# Patient Record
Sex: Female | Born: 1960 | Race: White | Hispanic: No | Marital: Married | Smoking: Never smoker
Health system: Southern US, Community
[De-identification: ages and names within clinical notes are randomized; demographics above are authoritative.]

## PROBLEM LIST (undated history)

## (undated) DIAGNOSIS — R112 Nausea with vomiting, unspecified: Secondary | ICD-10-CM

## (undated) DIAGNOSIS — F32A Depression, unspecified: Secondary | ICD-10-CM

## (undated) DIAGNOSIS — E079 Disorder of thyroid, unspecified: Secondary | ICD-10-CM

## (undated) DIAGNOSIS — Z9889 Other specified postprocedural states: Secondary | ICD-10-CM

## (undated) DIAGNOSIS — F419 Anxiety disorder, unspecified: Secondary | ICD-10-CM

## (undated) DIAGNOSIS — E039 Hypothyroidism, unspecified: Secondary | ICD-10-CM

## (undated) DIAGNOSIS — T8859XA Other complications of anesthesia, initial encounter: Secondary | ICD-10-CM

## (undated) HISTORY — DX: Anxiety disorder, unspecified: F41.9

## (undated) HISTORY — DX: Disorder of thyroid, unspecified: E07.9

## (undated) HISTORY — DX: Hypothyroidism, unspecified: E03.9

## (undated) HISTORY — DX: Depression, unspecified: F32.A

## (undated) HISTORY — PX: OTHER SURGICAL HISTORY: SHX169

## (undated) HISTORY — PX: ABDOMINAL HYSTERECTOMY: SHX81

---

## 2006-04-08 ENCOUNTER — Encounter: Admission: RE | Admit: 2006-04-08 | Discharge: 2006-04-08 | Payer: Self-pay | Admitting: Unknown Physician Specialty

## 2006-10-08 ENCOUNTER — Encounter: Admission: RE | Admit: 2006-10-08 | Discharge: 2006-10-08 | Payer: Self-pay | Admitting: Unknown Physician Specialty

## 2007-04-13 ENCOUNTER — Encounter: Admission: RE | Admit: 2007-04-13 | Discharge: 2007-04-13 | Payer: Self-pay | Admitting: Unknown Physician Specialty

## 2007-10-27 ENCOUNTER — Encounter: Admission: RE | Admit: 2007-10-27 | Discharge: 2007-10-27 | Payer: Self-pay | Admitting: Unknown Physician Specialty

## 2008-04-26 ENCOUNTER — Encounter: Admission: RE | Admit: 2008-04-26 | Discharge: 2008-04-26 | Payer: Self-pay | Admitting: Unknown Physician Specialty

## 2009-06-15 ENCOUNTER — Encounter: Admission: RE | Admit: 2009-06-15 | Discharge: 2009-06-15 | Payer: Self-pay | Admitting: Unknown Physician Specialty

## 2010-05-18 ENCOUNTER — Encounter: Admission: RE | Admit: 2010-05-18 | Discharge: 2010-05-18 | Payer: Self-pay | Admitting: Unknown Physician Specialty

## 2010-08-26 ENCOUNTER — Encounter: Payer: Self-pay | Admitting: Unknown Physician Specialty

## 2012-11-12 ENCOUNTER — Other Ambulatory Visit: Payer: Self-pay

## 2012-11-12 DIAGNOSIS — Z1231 Encounter for screening mammogram for malignant neoplasm of breast: Secondary | ICD-10-CM

## 2012-12-07 ENCOUNTER — Ambulatory Visit: Payer: Self-pay

## 2021-07-04 ENCOUNTER — Other Ambulatory Visit: Payer: Self-pay | Admitting: Internal Medicine

## 2021-07-04 DIAGNOSIS — R921 Mammographic calcification found on diagnostic imaging of breast: Secondary | ICD-10-CM

## 2021-07-09 ENCOUNTER — Other Ambulatory Visit: Payer: Self-pay | Admitting: Internal Medicine

## 2021-07-09 DIAGNOSIS — R921 Mammographic calcification found on diagnostic imaging of breast: Secondary | ICD-10-CM

## 2021-07-20 ENCOUNTER — Ambulatory Visit
Admission: RE | Admit: 2021-07-20 | Discharge: 2021-07-20 | Disposition: A | Discharge status: Home / Self Care | Payer: BC Managed Care – PPO | Source: Ambulatory Visit | Attending: Internal Medicine | Admitting: Internal Medicine

## 2021-07-20 ENCOUNTER — Ambulatory Visit
Admission: RE | Admit: 2021-07-20 | Discharge: 2021-07-20 | Disposition: A | Discharge status: Home / Self Care | Payer: Self-pay | Source: Ambulatory Visit | Attending: Internal Medicine | Admitting: Internal Medicine

## 2021-07-20 DIAGNOSIS — C50919 Malignant neoplasm of unspecified site of unspecified female breast: Secondary | ICD-10-CM

## 2021-07-20 DIAGNOSIS — R921 Mammographic calcification found on diagnostic imaging of breast: Secondary | ICD-10-CM

## 2021-07-20 HISTORY — DX: Malignant neoplasm of unspecified site of unspecified female breast: C50.919

## 2021-08-02 ENCOUNTER — Other Ambulatory Visit: Payer: Self-pay | Admitting: *Deleted

## 2021-08-02 ENCOUNTER — Telehealth: Payer: Self-pay | Admitting: Hematology

## 2021-08-02 DIAGNOSIS — D0512 Intraductal carcinoma in situ of left breast: Secondary | ICD-10-CM

## 2021-08-02 NOTE — Telephone Encounter (Signed)
Scheduled appt per 12/28 referral. Pt is aware of appt date and time. Pt is aware to arrive 15 mins prior to appt time.

## 2021-08-08 ENCOUNTER — Other Ambulatory Visit: Payer: Self-pay | Admitting: Surgery

## 2021-08-08 DIAGNOSIS — D0512 Intraductal carcinoma in situ of left breast: Secondary | ICD-10-CM

## 2021-08-08 NOTE — Progress Notes (Signed)
New Breast Cancer Diagnosis: Left Breast  Did patient present with symptoms (if so, please note symptoms) or screening mammography?:Screening Calcifications    Location and Extent of disease :left breast. Located in the  posterior central medial and anterior lower medial position, measured 0.3 cm in greatest dimension. 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 Ninfa Linden 07/31/2021 -Given the large area of calcifications, breast MRI is recommended to evaluate the extent of the disease preoperatively to see if she is still a candidate for breast conservation.  -At the current size, I believe she would still be a candidate for a lumpectomy. -She will be referred to medical and radiation oncology as well.  -I will call back with results of the MRI we will determine how to proceed further. She and her husband agreed with plan.  Medical oncologist, treatment if any:   Dr. Burr Medico 08/16/2020  Family History of Breast/Ovarian/Prostate Cancer: No  Lymphedema issues, if any:No      Pain issues, if any: Has knots at the biopsy sites, tenderness occasionally.    SAFETY ISSUES: Prior radiation? No Pacemaker/ICD? No Possible current pregnancy? No Is the patient on methotrexate? No  Current Complaints / other details:   -Patient waiting on MRI breast results to determine surgical approach. -Undecided if she wants treatment here or closer to home in [place].

## 2021-08-09 ENCOUNTER — Other Ambulatory Visit: Payer: Self-pay

## 2021-08-09 ENCOUNTER — Ambulatory Visit
Admission: RE | Admit: 2021-08-09 | Discharge: 2021-08-09 | Disposition: A | Discharge status: Home / Self Care | Payer: BC Managed Care – PPO | Source: Ambulatory Visit | Attending: Radiation Oncology | Admitting: Radiation Oncology

## 2021-08-09 ENCOUNTER — Encounter: Payer: Self-pay | Admitting: Radiation Oncology

## 2021-08-09 VITALS — BP 149/80 | HR 66 | Temp 96.9°F | Resp 18 | Ht 66.0 in | Wt 179.5 lb

## 2021-08-09 DIAGNOSIS — Z79899 Other long term (current) drug therapy: Secondary | ICD-10-CM | POA: Diagnosis not present

## 2021-08-09 DIAGNOSIS — D0512 Intraductal carcinoma in situ of left breast: Secondary | ICD-10-CM | POA: Insufficient documentation

## 2021-08-09 DIAGNOSIS — C50912 Malignant neoplasm of unspecified site of left female breast: Secondary | ICD-10-CM

## 2021-08-09 DIAGNOSIS — Z85820 Personal history of malignant melanoma of skin: Secondary | ICD-10-CM | POA: Diagnosis not present

## 2021-08-09 DIAGNOSIS — Z170 Estrogen receptor positive status [ER+]: Secondary | ICD-10-CM | POA: Diagnosis not present

## 2021-08-09 NOTE — Progress Notes (Signed)
Radiation Oncology         [phone] ________________________________  Name: Carmen Gordon        MRN: [id number]  Date of Service: 08/09/2021 DOB: 29-May-1961  CC:[place], MD  Coralie Keens, MD     REFERRING PHYSICIAN: Coralie Keens, MD   DIAGNOSIS: The encounter diagnosis was Ductal carcinoma of left breast [facility]).   HISTORY OF PRESENT ILLNESS: Carmen Gordon is a 61 y.o. female seen for a new diagnosis of left breast cancer. The patient was noted to have screening detected calcifications in the left breast measuring 5.3 cm. She underwent biopsies of the anterior and posterior aspect of this span and both biopsies showed intermediate grade, ER/PR positive DCIS with calcifications. She is getting scheduled for an MRI to evaluate extent of disease but hopes to proceed with lumpectomy surgery. She's scheduled to meet Dr. Burr Medico next week and is seen to discuss adjuvant radiotherapy.    PREVIOUS RADIATION THERAPY: No   PAST MEDICAL HISTORY:  Past Medical History:  Diagnosis Date   Breast cancer ([place]) 07/20/2021       PAST SURGICAL HISTORY:History reviewed. No pertinent surgical history.   FAMILY HISTORY:  Family History  Problem Relation Age of Onset   Melanoma Maternal Uncle      SOCIAL HISTORY:  reports that she has never smoked. She has never used smokeless tobacco. She reports that she does not drink alcohol. The patient is married and lives in [place]. She is accompanied by her husband. 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    ALLERGIES: Patient has no allergy information on record.   MEDICATIONS:  Current Outpatient Medications  Medication Sig Dispense Refill   buPROPion (WELLBUTRIN SR) 150 MG 12 hr tablet Take 150 mg by mouth 2 (two) times daily.     FLUoxetine (PROZAC) 20 MG capsule Take by mouth.     ibuprofen (ADVIL) 200 MG tablet Take by mouth.     levothyroxine (SYNTHROID) 75 MCG tablet Take 75 mcg by mouth daily.      No current facility-administered medications for this encounter.     REVIEW OF SYSTEMS: On review of systems, the patient reports that she is doing well overall but frustrated that it's taking so long to communicate between schedulers and insurance authorization to get her MRI approved. Tenderness at her biopsy sites and some fullness are noted. No other complaints are noted.      PHYSICAL EXAM:  Wt Readings from Last 3 Encounters:  08/09/21 179 lb 8 oz (81.4 kg)   Temp Readings from Last 3 Encounters:  08/09/21 (!) 96.9 F (36.1 C) (Temporal)   BP Readings from Last 3 Encounters:  08/09/21 (!) 149/80   Pulse Readings from Last 3 Encounters:  08/09/21 66    In general this is a well appearing caucasian female in no acute distress. She's alert and oriented x4 and appropriate throughout the examination. Cardiopulmonary assessment is negative for acute distress and she exhibits normal effort. Bilateral breast exam is deferred.    ECOG = 1  0 - Asymptomatic (Fully active, able to carry on all predisease activities without restriction)  1 - Symptomatic but completely ambulatory (Restricted in physically strenuous activity but ambulatory and able to carry out work of a light or sedentary nature. For example, light housework, office work)  2 - Symptomatic, <50% in bed during the day (Ambulatory and capable of all self care but unable to carry out any work activities.  Up and about more than 50% of waking hours)  3 - Symptomatic, >50% in bed, but not bedbound (Capable of only limited self-care, confined to bed or chair 50% or more of waking hours)  4 - Bedbound (Completely disabled. Cannot carry on any self-care. Totally confined to bed or chair)  5 - Death   Carmen Gordon MM, Carmen Gordon, Carmen Gordon, et al. [phone]). "Toxicity and response criteria of the [facility]". [facility] Oncol. 5 (6): 649-55    LABORATORY DATA:  No results found for: WBC, HGB, HCT, MCV,  PLT No results found for: NA, K, CL, CO2 No results found for: ALT, AST, GGT, ALKPHOS, BILITOT    RADIOGRAPHY: MM CLIP PLACEMENT LEFT  Result Date: 07/20/2021 CLINICAL DATA:  Status post left breast stereotactic biopsy. EXAM: 3D DIAGNOSTIC LEFT MAMMOGRAM POST STEREOTACTIC BIOPSY COMPARISON:  Previous exam(s). FINDINGS: 3D Mammographic images were obtained following stereotactic guided biopsy of the left breast. Both biopsy marking clips are in the expected locations. IMPRESSION: Appropriate positioning of the X and coil shaped biopsy marking clips in the medial left breast. Final Assessment: Post Procedure Mammograms for Marker Placement Electronically Signed   By: Kristopher Oppenheim M.D.   On: 07/20/2021 10:14  MM LT BREAST BX W LOC DEV 1ST LESION IMAGE BX SPEC STEREO GUIDE  Addendum Date: 07/23/2021   ADDENDUM REPORT: 07/23/2021 14:44 ADDENDUM: Pathology revealed INTERMEDIATE GRADE CARCINOMA IN SITU WITH CALCIFICATIONS of the LEFT breast central medial, posterior (x clip). This was found to be concordant by Dr. Kristopher Oppenheim. Pathology revealed INTERMEDIATE GRADE DUCTAL CARCINOMA IN SITU WITH CALCIFICATIONS of the LEFT breast, lower medial, anterior (coil clip). This was found to be concordant by Dr. Kristopher Oppenheim. Pathology results were discussed with the patient by telephone. The patient reported doing well after the biopsies with tenderness at the sites. Post biopsy instructions and care were reviewed and questions were answered. The patient was encouraged to call [facility] for any additional concerns. Per patient request, surgical consultation has been arranged with Dr. Nedra Hai at [facility] on July 31, 2021. Breast MRI recommended given the extent of disease. Pathology results reported by Stacie Acres RN on 07/23/2021. Electronically Signed   By: Kristopher Oppenheim M.D.   On: 07/23/2021 14:44   Result Date: 07/23/2021 CLINICAL DATA:  61 year old  female with indeterminate left breast calcifications. EXAM: LEFT BREAST STEREOTACTIC CORE NEEDLE BIOPSY COMPARISON:  Previous exams. FINDINGS: The patient and I discussed the procedure of stereotactic-guided biopsy including benefits and alternatives. We discussed the high likelihood of a successful procedure. We discussed the risks of the procedure including infection, bleeding, tissue injury, clip migration, and inadequate sampling. Informed written consent was given. The usual time out protocol was performed immediately prior to the procedure. Lesion quadrant: Upper inner quadrant Using sterile technique and 1% Lidocaine as local anesthetic, under stereotactic guidance, a 9 gauge vacuum assisted device was used to perform core needle biopsy of calcifications in the far medial left breast at posterior depth using a medial approach. Specimen radiograph was performed showing calcifications within the specimen. Specimens with calcifications are identified for pathology. At the conclusion of the procedure, an X shaped shaped tissue marker clip was deployed into the biopsy cavity. Lesion quadrant: Lower inner quadrant Using sterile technique and 1% Lidocaine as local anesthetic, under stereotactic guidance, a 9 gauge vacuum assisted device was used to perform core needle biopsy of calcifications in the medial left breast at anterior depth  using a medial approach. Specimen radiograph was performed showing calcifications within the specimens. Specimens with calcifications are identified for pathology. At the conclusion of the procedure, a coil shaped tissue marker clip was deployed into the biopsy cavity. Follow-up 2-view mammogram was performed and dictated separately. IMPRESSION: Stereotactic-guided biopsy of the left breast x2. No apparent complications. Electronically Signed: By: Kristopher Oppenheim M.D. On: 07/20/2021 10:13  MM LT BREAST BX W LOC DEV EA AD LESION IMG BX SPEC STEREO GUIDE  Addendum Date: 07/23/2021    ADDENDUM REPORT: 07/23/2021 14:44 ADDENDUM: Pathology revealed INTERMEDIATE GRADE CARCINOMA IN SITU WITH CALCIFICATIONS of the LEFT breast central medial, posterior (x clip). This was found to be concordant by Dr. Kristopher Oppenheim. Pathology revealed INTERMEDIATE GRADE DUCTAL CARCINOMA IN SITU WITH CALCIFICATIONS of the LEFT breast, lower medial, anterior (coil clip). This was found to be concordant by Dr. Kristopher Oppenheim. Pathology results were discussed with the patient by telephone. The patient reported doing well after the biopsies with tenderness at the sites. Post biopsy instructions and care were reviewed and questions were answered. The patient was encouraged to call [facility] for any additional concerns. Per patient request, surgical consultation has been arranged with Dr. Nedra Hai at [facility] on July 31, 2021. Breast MRI recommended given the extent of disease. Pathology results reported by Stacie Acres RN on 07/23/2021. Electronically Signed   By: Kristopher Oppenheim M.D.   On: 07/23/2021 14:44   Result Date: 07/23/2021 CLINICAL DATA:  61 year old female with indeterminate left breast calcifications. EXAM: LEFT BREAST STEREOTACTIC CORE NEEDLE BIOPSY COMPARISON:  Previous exams. FINDINGS: The patient and I discussed the procedure of stereotactic-guided biopsy including benefits and alternatives. We discussed the high likelihood of a successful procedure. We discussed the risks of the procedure including infection, bleeding, tissue injury, clip migration, and inadequate sampling. Informed written consent was given. The usual time out protocol was performed immediately prior to the procedure. Lesion quadrant: Upper inner quadrant Using sterile technique and 1% Lidocaine as local anesthetic, under stereotactic guidance, a 9 gauge vacuum assisted device was used to perform core needle biopsy of calcifications in the far medial left breast at posterior depth  using a medial approach. Specimen radiograph was performed showing calcifications within the specimen. Specimens with calcifications are identified for pathology. At the conclusion of the procedure, an X shaped shaped tissue marker clip was deployed into the biopsy cavity. Lesion quadrant: Lower inner quadrant Using sterile technique and 1% Lidocaine as local anesthetic, under stereotactic guidance, a 9 gauge vacuum assisted device was used to perform core needle biopsy of calcifications in the medial left breast at anterior depth using a medial approach. Specimen radiograph was performed showing calcifications within the specimens. Specimens with calcifications are identified for pathology. At the conclusion of the procedure, a coil shaped tissue marker clip was deployed into the biopsy cavity. Follow-up 2-view mammogram was performed and dictated separately. IMPRESSION: Stereotactic-guided biopsy of the left breast x2. No apparent complications. Electronically Signed: By: Kristopher Oppenheim M.D. On: 07/20/2021 10:13      IMPRESSION/PLAN: 1. Intermediate grade, ER/PR positive DCIS of the left breast. Dr. Lisbeth Renshaw has reveiwed her case. Today I discussed the pathology findings and reviewed the nature of noninvasive left breast disease. She is hoping that after MRI she is still a good candidate for breast conservation surgery with lumpectomy. We discussed the rationale for adjuvant external radiotherapy to the breast  to reduce risks of local recurrence followed by antiestrogen  therapy. We discussed the risks, benefits, short, and long term effects of radiotherapy, as well as the curative intent, and the patient is interested in proceeding. I discussed the delivery and logistics of radiotherapy and that Dr. Lisbeth Renshaw would offer a course of 4 weeks of radiotherapy to the left breast with deep inspiration breath hold technique. After reviewing the logistics of treatment and the distance she lives and works from our center,  she is in agreement with meeting Dr. Lynnette Caffey in radiation oncology at [facility] in [place]. We would be happy to see her back as needed or if after that visit prefers to have treatment in [place].    In a visit lasting 60 minutes, greater than 50% of the time was spent face to face reviewing her case, as well as in preparation of, discussing, and coordinating the patient's care.       Carola Rhine, [facility]    **Disclaimer: This note was dictated with voice recognition software. Similar sounding words can inadvertently be transcribed and this note may contain transcription errors which may not have been corrected upon publication of note.**

## 2021-08-15 ENCOUNTER — Other Ambulatory Visit: Payer: Self-pay

## 2021-08-15 ENCOUNTER — Ambulatory Visit
Admission: RE | Admit: 2021-08-15 | Discharge: 2021-08-15 | Disposition: A | Discharge status: Home / Self Care | Payer: BC Managed Care – PPO | Source: Ambulatory Visit | Attending: Surgery | Admitting: Surgery

## 2021-08-15 DIAGNOSIS — D0512 Intraductal carcinoma in situ of left breast: Secondary | ICD-10-CM

## 2021-08-15 MED ORDER — GADOBUTROL 1 MMOL/ML IV SOLN
9.0000 mL | Freq: Once | INTRAVENOUS | Status: AC | PRN
  Administered 2021-08-15: 9 mL via INTRAVENOUS

## 2021-08-16 ENCOUNTER — Telehealth: Payer: Self-pay | Admitting: *Deleted

## 2021-08-16 ENCOUNTER — Encounter: Payer: Self-pay | Admitting: Hematology

## 2021-08-16 ENCOUNTER — Inpatient Hospital Stay: Payer: BC Managed Care – PPO | Attending: Hematology | Admitting: Hematology

## 2021-08-16 VITALS — BP 131/56 | HR 79 | Temp 97.9°F | Resp 18 | Ht 66.0 in | Wt 181.3 lb

## 2021-08-16 DIAGNOSIS — F419 Anxiety disorder, unspecified: Secondary | ICD-10-CM | POA: Insufficient documentation

## 2021-08-16 DIAGNOSIS — K769 Liver disease, unspecified: Secondary | ICD-10-CM | POA: Diagnosis not present

## 2021-08-16 DIAGNOSIS — R5383 Other fatigue: Secondary | ICD-10-CM | POA: Insufficient documentation

## 2021-08-16 DIAGNOSIS — D0512 Intraductal carcinoma in situ of left breast: Secondary | ICD-10-CM | POA: Diagnosis present

## 2021-08-16 DIAGNOSIS — Z90710 Acquired absence of both cervix and uterus: Secondary | ICD-10-CM | POA: Diagnosis not present

## 2021-08-16 DIAGNOSIS — Z808 Family history of malignant neoplasm of other organs or systems: Secondary | ICD-10-CM | POA: Insufficient documentation

## 2021-08-16 DIAGNOSIS — Z170 Estrogen receptor positive status [ER+]: Secondary | ICD-10-CM | POA: Diagnosis not present

## 2021-08-16 DIAGNOSIS — E039 Hypothyroidism, unspecified: Secondary | ICD-10-CM | POA: Insufficient documentation

## 2021-08-16 NOTE — Progress Notes (Signed)
[place]   [phone] Fax:([phone] [place] Note   Patient Care Team: [place] Nation, MD as PCP - General (Internal Medicine) Truitt Merle, MD as Consulting Physician (Hematology) Coralie Keens, MD as Consulting Physician (General Surgery) Renaldo Harrison, MD as Referring Physician (Radiation Oncology)  Date of Service:  08/16/2021   CHIEF COMPLAINTS/PURPOSE OF CONSULTATION:  Left Breast DCIS, ER+  REFERRING PHYSICIAN:  Dr. Ninfa Linden   ASSESSMENT & PLAN:  Carmen Gordon is a 61 y.o. postmenopausal female with a history of  1. Left breast DCIS, grade 2, ER+/PR+ -found on screening mammogram. Left diagnostic MM on 07/04/21 showed microcalcifications spanning 5.3 cm. Biopsy 07/20/21 confirmed DCIS, intermediate grade, to both areas. -breast MRI 08/15/21 showed: 7 cm area of non-mass enhancement involving entirety of LIQ; indeterminate 4 mm focus in central lateral right breast; indeterminate 5-6 mm left axillary lymph node; multiple subcentimeter liver masses. -I discussed her breast imaging and needle biopsy results with patient and her family members in great detail. -we discussed lumpectomy versus mastectomy, given the large area of calcifications. She understands no radiation is needed after mastectomy. She will discuss further with Dr. Ninfa Linden. -We also discussed that biopsy may have sampling limitation, we will review her surgical path, to see if she has any invasive carcinoma components. -I discussed recommendation for liver MRI with possible biopsy, and additional right breast and left axillary lymph node biopsies. While the liver lesions are unlikely related to her breast cancer, we will obtain additional imaging for further evaluation. She understands that biopsy maybe needed to rule out malignancy, depends on the liver MRI findings. -Assuming there is no invasive component or other disease, her DCIS will be cured by  complete surgical resection. Any form of adjuvant therapy is preventive. -Given her strongly positive ER and PR, I recommend antiestrogen therapy, which will decrease her risk of future breast cancer by ~40%.  -She will likely benefit from breast radiation if she undergo lumpectomy to decrease the risk of breast cancer. She was seen by Dr. Lisbeth Renshaw on 08/09/21 and is scheduled to meet with Dr. Lynnette Caffey at [facility] on 08/23/21 to receive radiation closer to home. -We discussed breast cancer surveillance after she completes treatment, Including annual mammogram, breast exam every 6-12 months.  2. Bone Health  -She has never had a DEXA. She notes she is scheduled for baseline in 10/2021 with her PCP.   PLAN:  -second look [place] of left axilla and right breast with possible biopsy -liver MRI w and wo contrast in 1-2 weeks    Oncology History Overview Note   Cancer Staging  Ductal carcinoma of left breast ([place]) Staging form: Breast, AJCC 8th Edition - Clinical stage from 07/20/2021: Stage 0 (cTis (DCIS), cN0, cM0, G2, ER+, PR+, HER2: Not Assessed) - Signed by Truitt Merle, MD on 08/16/2021    Ductal carcinoma of left breast ([place])  07/04/2021 Mammogram   EXAM:  DIGITAL DIAGNOSTIC UNILATERAL LEFT MAMMOGRAM WITH TOMOSYNTHESIS AND CAD   Impression  Indeterminate microcalcifications over the inner midportion of the  left breast spanning 5.3 cm.    07/20/2021 Cancer Staging   Staging form: Breast, AJCC 8th Edition - Clinical stage from 07/20/2021: Stage 0 (cTis (DCIS), cN0, cM0, G2, ER+, PR+, HER2: Not Assessed) - Signed by Truitt Merle, MD on 08/16/2021 Stage prefix: Initial diagnosis Histologic grading system: 3 grade system    07/20/2021 Initial Biopsy   Diagnosis 1. Breast, left, needle core biopsy, central  medial, posterior, x clip - DUCTAL CARCINOMA IN SITU WITH CALCIFICATIONS - SEE COMMENT 2. Breast, left, needle core biopsy, lower medial, anterior, coil clip - DUCTAL CARCINOMA IN SITU  WITH CALCIFICATIONS - SEE COMMENT  Microscopic Comment 1. and 2. Based on the biopsy, the ductal carcinoma in situ has a cribriform pattern, intermediate nuclear grade and measures 0.3 cm in greatest linear extent.  2. PROGNOSTIC INDICATORS Results: Estrogen Receptor: 100%, POSITIVE, STRONG STAINING INTENSITY Progesterone Receptor: 60%, POSITIVE, STRONG STAINING INTENSITY   08/09/2021 Initial Diagnosis   Ductal carcinoma of left breast ([place])   08/15/2021 Imaging   EXAM: BILATERAL BREAST MRI WITH AND WITHOUT CONTRAST  ADDENDUM: IMPRESSION: 1. Subtle non-mass enhancement involving the entirety of the lower inner quadrant of the left breast corresponding with the patient's biopsy-proven DCIS. This measures approximately 7 x 4 x 3 cm (AP by transverse by craniocaudal dimensions). 2. Indeterminate 4 mm enhancing focus in the central lateral right breast (series 9, image 60/144). Recommendation is for MRI guided biopsy. 3. Indeterminate 5-6 mm low lying left axillary lymph node without clear fatty hilum. Recommendation is for second-look ultrasound for further characterization. 4. Multiple subcentimeter T2 hyperintense masses throughout the liver not further characterized on today's study. Recommend correlation with prior cross-sectional imaging if available or further evaluation with contrast enhanced MRI.      HISTORY OF PRESENTING ILLNESS:  Carmen Gordon 61 y.o. female is a here because of breast cancer. The patient was referred by Dr. Ninfa Linden. The patient presents to the clinic today accompanied by her husband.   She had routine screening mammography on 05/31/21 showing a possible abnormality in the left breast. She underwent left diagnostic mammography on 07/04/21 showing: indeterminate microcalficiations spanning 5.3 cm.  Biopsy on 07/20/21 showed: DCIS with calcifications to both samples. Prognostic indicators significant for: estrogen receptor, 100% positive and  progesterone receptor, 60% positive.   Today the patient notes they felt/feeling prior/after... -fatigue, unsure cause aside from work -weight gain of ~20 lbs in last few years -recent headaches, which she has no history of previously  She has a PMHx of.... -s/p hysterectomy at age 31 (ovaries in place) -hypothyroidism, on synthroid -anxiety, on prozac and wellbutrin, working on decreasing doses  Socially... 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  GYN HISTORY  Menarchal: xx LMP: age 79, with hysterectomy* Contraceptive: HRT:  GP: 2, first at age 32 *ovaries in place, experienced menopausal symptoms about 2-3 years ago.  REVIEW OF SYSTEMS:   Constitutional: Denies fevers, chills or abnormal night sweats Eyes: Denies blurriness of vision, double vision or watery eyes Ears, nose, mouth, throat, and face: Denies mucositis or sore throat Respiratory: Denies cough, dyspnea or wheezes Cardiovascular: Denies palpitation, chest discomfort or lower extremity swelling Gastrointestinal:  Denies nausea, heartburn or change in bowel habits Skin: Denies abnormal skin rashes Lymphatics: Denies new lymphadenopathy or easy bruising Neurological:Denies numbness, tingling or new weaknesses Behavioral/Psych: Mood is stable, no new changes  All other systems were reviewed with the patient and are negative.   MEDICAL HISTORY:  Past Medical History:  Diagnosis Date   Anxiety 2010   Breast cancer ([place]) 07/20/2021   Depression 2010   Hypothyroidism (acquired)    Thyroid disease 2010    SURGICAL HISTORY: Past Surgical History:  Procedure Laterality Date   ABDOMINAL HYSTERECTOMY  2001   foot surgery      SOCIAL HISTORY: Social History  Socioeconomic History   Marital status: Married    Spouse name: Not on file   Number of children: 2   Years of education: Not on file   Highest education  level: Not on file  Occupational History   Not on file  Tobacco Use   Smoking status: Never   Smokeless tobacco: Never   Tobacco comments:    Father smoked  Substance and Sexual Activity   Alcohol use: Never   Drug use: Never   Sexual activity: Yes    Birth control/protection: Post-menopausal  Other Topics Concern   Not on file  Social History Narrative   Not on file   Social Determinants of Health   Financial Resource Strain: Not on file  Food Insecurity: Not on file  Transportation Needs: Not on file  Physical Activity: Not on file  Stress: Not on file  Social Connections: Not on file  Intimate Partner Violence: Not on file    FAMILY HISTORY: Family History  Problem Relation Age of Onset   Melanoma Maternal Uncle    COPD Mother    Heart disease Father     ALLERGIES:  has no allergies on file.  MEDICATIONS:  Current Outpatient Medications  Medication Sig Dispense Refill   buPROPion (WELLBUTRIN SR) 150 MG 12 hr tablet Take 150 mg by mouth 2 (two) times daily.     FLUoxetine (PROZAC) 20 MG capsule Take by mouth.     ibuprofen (ADVIL) 200 MG tablet Take by mouth.     levothyroxine (SYNTHROID) 75 MCG tablet Take 75 mcg by mouth daily.     No current facility-administered medications for this visit.    PHYSICAL EXAMINATION: ECOG PERFORMANCE STATUS: 0 - Asymptomatic  Vitals:   08/16/21 1513  BP: (!) 131/56  Pulse: 79  Resp: 18  Temp: 97.9 F (36.6 C)  SpO2: 99%   Filed Weights   08/16/21 1513  Weight: 181 lb 4.8 oz (82.2 kg)    GENERAL:alert, no distress and comfortable SKIN: skin color, texture, turgor are normal, no rashes or significant lesions EYES: normal, Conjunctiva are pink and non-injected, sclera clear  NECK: supple, thyroid normal size, non-tender, without nodularity LYMPH:  no palpable lymphadenopathy in the cervical, axillary  LUNGS: clear to auscultation and percussion with normal breathing effort HEART: regular rate & rhythm and no  murmurs and no lower extremity edema ABDOMEN:abdomen soft, non-tender and normal bowel sounds Musculoskeletal:no cyanosis of digits and no clubbing  NEURO: alert & oriented x 3 with fluent speech, no focal motor/sensory deficits BREAST: No palpable mass, nodules or adenopathy bilaterally. Breast exam benign.  LABORATORY DATA:  I have reviewed the data as listed No flowsheet data found.  No flowsheet data found.   RADIOGRAPHIC STUDIES: I have personally reviewed the radiological images as listed and agreed with the findings in the report. MR BREAST BILATERAL W WO CONTRAST INC CAD  Addendum Date: 08/15/2021   ADDENDUM REPORT: 08/15/2021 13:21 ADDENDUM: IMPRESSION: 1. Subtle non-mass enhancement involving the entirety of the lower inner quadrant of the left breast corresponding with the patient's biopsy-proven DCIS. This measures approximately 7 x 4 x 3 cm (AP by transverse by craniocaudal dimensions). 2. Indeterminate 4 mm enhancing focus in the central lateral right breast (series 9, image 60/144). Recommendation is for MRI guided biopsy. 3. Indeterminate 5-6 mm low lying left axillary lymph node without clear fatty hilum. Recommendation is for second-look ultrasound for further characterization. 4. Multiple subcentimeter T2 hyperintense masses throughout the liver not further characterized on  today's study. Recommend correlation with prior cross-sectional imaging if available or further evaluation with contrast enhanced MRI. RECOMMENDATION: 1. Second-look ultrasound of the left axilla for further evaluation of a 5-6 mm low lying lymph node. Ultrasound-guided biopsy is recommended if the lymph node appears abnormal at that time. 2. Additionally, evaluation of the lateral right breast can be performed with ultrasound at the same time for further evaluation of the 4 mm enhancing focus. However, this is felt to be less likely to be seen by ultrasound. 3. If no correlate is identified for the 4 mm right  breast enhancing focus, MRI guided biopsy is recommended. 4. Recommend correlation with prior cross-sectional imaging for evaluation of multiple subcentimeter liver masses. If no prior imaging is available for comparison, recommend further evaluation with contrast enhanced MRI. BI-RADS CATEGORY 4: Suspicious. Electronically Signed   By: Kristopher Oppenheim M.D.   On: 08/15/2021 13:21   Result Date: 08/15/2021 CLINICAL DATA:  61 year old female with newly diagnosed left breast DCIS. EXAM: BILATERAL BREAST MRI WITH AND WITHOUT CONTRAST TECHNIQUE: Multiplanar, multisequence MR images of both breasts were obtained prior to and following the intravenous administration of 9 ml of Gadavist. Three-dimensional MR images were rendered by post-processing of the original MR data on an independent workstation. The three-dimensional MR images were interpreted, and findings are reported in the following complete MRI report for this study. Three dimensional images were evaluated at the independent interpreting workstation using the DynaCAD thin client. COMPARISON:  Previous exam(s). FINDINGS: Breast composition: c. Heterogeneous fibroglandular tissue. Background parenchymal enhancement: Mild to moderate. Right breast: There is a 4 mm irregular focus in the central slightly lateral right breast at posterior depth (series 9, image 60/144). It demonstrates progressive enhancement kinetics. No other suspicious masses or enhancement in the remainder of the right breast. Left breast: Susceptibility artifact from post biopsy clips is demonstrated in the medial left breast at anterior and posterior depth. This corresponds with the 2 sites of biopsy-proven malignancy. Subtle non mass enhancement is noted between in surrounding the 2 post biopsy clips likely representing the extent of DCIS. This spans approximately 7 cm in the AP dimension, 4 cm in the transverse dimension and 3 cm in the craniocaudal dimension. Findings involve the entirety of  the lower inner quadrant of the left breast. Lymph nodes: There is a 5-6 mm lymph node in the low left axilla without a clear fatty hilum. Otherwise, no suspicious right axillary or internal mammary chain lymphadenopathy. Ancillary findings: Multiple subcentimeter T2 hyperintense masses throughout the liver not further characterized on today's study. IMPRESSION: 1. Subtle non-mass enhancement involving the entirety of the lower inner quadrant of the left breast corresponding with the patient's biopsy-proven DCIS. This measures approximately 7 x 4 x 3 cm (AP by transverse by craniocaudal dimensions). 2. Indeterminate 4 cm enhancing focus in the central lateral right breast (series 9, image 60/144). Recommendation is for MRI guided biopsy. 3. Indeterminate 5-6 mm low lying left axillary lymph node without clear fatty hilum. Recommendation is for second-look ultrasound for further characterization. 4. Multiple subcentimeter T2 hyperintense masses throughout the liver not further characterized on today's study. Recommend correlation with prior cross-sectional imaging if available or further evaluation with contrast enhanced MRI. RECOMMENDATION: 1. Second-look ultrasound of the left axilla for further evaluation of a 5-6 mm low lying lymph node. Ultrasound-guided biopsy is recommended if the lymph node appears abnormal at that time. 2. Additionally, evaluation of the lateral right breast can be performed with ultrasound at the same time  for further evaluation of the 4 cm enhancing focus. However, this is felt to be less likely to be seen by ultrasound. 3. If no correlate is identified for the 4 mm right breast mass, MRI guided biopsy is recommended. 4. Recommend correlation with prior cross-sectional imaging for evaluation of multiple subcentimeter liver masses. If no prior imaging is available for comparison, recommend further evaluation with contrast enhanced MRI. BI-RADS CATEGORY  4: Suspicious. Electronically Signed:  By: Kristopher Oppenheim M.D. On: 08/15/2021 09:10  MM CLIP PLACEMENT LEFT  Result Date: 07/20/2021 CLINICAL DATA:  Status post left breast stereotactic biopsy. EXAM: 3D DIAGNOSTIC LEFT MAMMOGRAM POST STEREOTACTIC BIOPSY COMPARISON:  Previous exam(s). FINDINGS: 3D Mammographic images were obtained following stereotactic guided biopsy of the left breast. Both biopsy marking clips are in the expected locations. IMPRESSION: Appropriate positioning of the X and coil shaped biopsy marking clips in the medial left breast. Final Assessment: Post Procedure Mammograms for Marker Placement Electronically Signed   By: Kristopher Oppenheim M.D.   On: 07/20/2021 10:14  MM LT BREAST BX W LOC DEV 1ST LESION IMAGE BX SPEC STEREO GUIDE  Addendum Date: 07/23/2021   ADDENDUM REPORT: 07/23/2021 14:44 ADDENDUM: Pathology revealed INTERMEDIATE GRADE CARCINOMA IN SITU WITH CALCIFICATIONS of the LEFT breast central medial, posterior (x clip). This was found to be concordant by Dr. Kristopher Oppenheim. Pathology revealed INTERMEDIATE GRADE DUCTAL CARCINOMA IN SITU WITH CALCIFICATIONS of the LEFT breast, lower medial, anterior (coil clip). This was found to be concordant by Dr. Kristopher Oppenheim. Pathology results were discussed with the patient by telephone. The patient reported doing well after the biopsies with tenderness at the sites. Post biopsy instructions and care were reviewed and questions were answered. The patient was encouraged to call [facility] for any additional concerns. Per patient request, surgical consultation has been arranged with Dr. Nedra Hai at [facility] on July 31, 2021. Breast MRI recommended given the extent of disease. Pathology results reported by Stacie Acres RN on 07/23/2021. Electronically Signed   By: Kristopher Oppenheim M.D.   On: 07/23/2021 14:44   Result Date: 07/23/2021 CLINICAL DATA:  61 year old female with indeterminate left breast calcifications. EXAM: LEFT BREAST  STEREOTACTIC CORE NEEDLE BIOPSY COMPARISON:  Previous exams. FINDINGS: The patient and I discussed the procedure of stereotactic-guided biopsy including benefits and alternatives. We discussed the high likelihood of a successful procedure. We discussed the risks of the procedure including infection, bleeding, tissue injury, clip migration, and inadequate sampling. Informed written consent was given. The usual time out protocol was performed immediately prior to the procedure. Lesion quadrant: Upper inner quadrant Using sterile technique and 1% Lidocaine as local anesthetic, under stereotactic guidance, a 9 gauge vacuum assisted device was used to perform core needle biopsy of calcifications in the far medial left breast at posterior depth using a medial approach. Specimen radiograph was performed showing calcifications within the specimen. Specimens with calcifications are identified for pathology. At the conclusion of the procedure, an X shaped shaped tissue marker clip was deployed into the biopsy cavity. Lesion quadrant: Lower inner quadrant Using sterile technique and 1% Lidocaine as local anesthetic, under stereotactic guidance, a 9 gauge vacuum assisted device was used to perform core needle biopsy of calcifications in the medial left breast at anterior depth using a medial approach. Specimen radiograph was performed showing calcifications within the specimens. Specimens with calcifications are identified for pathology. At the conclusion of the procedure, a coil shaped tissue marker clip was deployed into  the biopsy cavity. Follow-up 2-view mammogram was performed and dictated separately. IMPRESSION: Stereotactic-guided biopsy of the left breast x2. No apparent complications. Electronically Signed: By: Kristopher Oppenheim M.D. On: 07/20/2021 10:13  MM LT BREAST BX W LOC DEV EA AD LESION IMG BX SPEC STEREO GUIDE  Addendum Date: 07/23/2021   ADDENDUM REPORT: 07/23/2021 14:44 ADDENDUM: Pathology revealed  INTERMEDIATE GRADE CARCINOMA IN SITU WITH CALCIFICATIONS of the LEFT breast central medial, posterior (x clip). This was found to be concordant by Dr. Kristopher Oppenheim. Pathology revealed INTERMEDIATE GRADE DUCTAL CARCINOMA IN SITU WITH CALCIFICATIONS of the LEFT breast, lower medial, anterior (coil clip). This was found to be concordant by Dr. Kristopher Oppenheim. Pathology results were discussed with the patient by telephone. The patient reported doing well after the biopsies with tenderness at the sites. Post biopsy instructions and care were reviewed and questions were answered. The patient was encouraged to call [facility] for any additional concerns. Per patient request, surgical consultation has been arranged with Dr. Nedra Hai at [facility] on July 31, 2021. Breast MRI recommended given the extent of disease. Pathology results reported by Stacie Acres RN on 07/23/2021. Electronically Signed   By: Kristopher Oppenheim M.D.   On: 07/23/2021 14:44   Result Date: 07/23/2021 CLINICAL DATA:  61 year old female with indeterminate left breast calcifications. EXAM: LEFT BREAST STEREOTACTIC CORE NEEDLE BIOPSY COMPARISON:  Previous exams. FINDINGS: The patient and I discussed the procedure of stereotactic-guided biopsy including benefits and alternatives. We discussed the high likelihood of a successful procedure. We discussed the risks of the procedure including infection, bleeding, tissue injury, clip migration, and inadequate sampling. Informed written consent was given. The usual time out protocol was performed immediately prior to the procedure. Lesion quadrant: Upper inner quadrant Using sterile technique and 1% Lidocaine as local anesthetic, under stereotactic guidance, a 9 gauge vacuum assisted device was used to perform core needle biopsy of calcifications in the far medial left breast at posterior depth using a medial approach. Specimen radiograph was performed showing  calcifications within the specimen. Specimens with calcifications are identified for pathology. At the conclusion of the procedure, an X shaped shaped tissue marker clip was deployed into the biopsy cavity. Lesion quadrant: Lower inner quadrant Using sterile technique and 1% Lidocaine as local anesthetic, under stereotactic guidance, a 9 gauge vacuum assisted device was used to perform core needle biopsy of calcifications in the medial left breast at anterior depth using a medial approach. Specimen radiograph was performed showing calcifications within the specimens. Specimens with calcifications are identified for pathology. At the conclusion of the procedure, a coil shaped tissue marker clip was deployed into the biopsy cavity. Follow-up 2-view mammogram was performed and dictated separately. IMPRESSION: Stereotactic-guided biopsy of the left breast x2. No apparent complications. Electronically Signed: By: Kristopher Oppenheim M.D. On: 07/20/2021 10:13    Orders Placed This Encounter  Procedures   MR LIVER W WO CONTRAST    Evaluate liver lesions which were found on recent breast MRI    Standing Status:   Future    Standing Expiration Date:   08/16/2022    Order Specific Question:   If indicated for the ordered procedure, I authorize the administration of contrast media per Radiology protocol    Answer:   Yes    Order Specific Question:   What is the patient's sedation requirement?    Answer:   No Sedation    Order Specific Question:   Does the patient have  a pacemaker or implanted devices?    Answer:   No    Order Specific Question:   Release to patient    Answer:   Immediate    Order Specific Question:   Preferred imaging location?    Answer:   [facility] (table limit-550lbs)    All questions were answered. The patient knows to call the clinic with any problems, questions or concerns. The total time spent in the appointment was 60 minutes.     Truitt Merle, MD 08/16/2021   I, Wilburn Mylar, am acting as scribe for Truitt Merle, MD.   I have reviewed the above documentation for accuracy and completeness, and I agree with the above.

## 2021-08-16 NOTE — Telephone Encounter (Signed)
RECEIVED PHONE CALL FROM [facility], THIS PATIENT WILL SEE DR. MORRIS ON 08-23-21 @ 2:15 PM

## 2021-08-17 ENCOUNTER — Other Ambulatory Visit: Payer: Self-pay | Admitting: *Deleted

## 2021-08-17 ENCOUNTER — Other Ambulatory Visit: Payer: Self-pay | Admitting: Hematology

## 2021-08-17 ENCOUNTER — Telehealth: Payer: Self-pay | Admitting: *Deleted

## 2021-08-17 DIAGNOSIS — R928 Other abnormal and inconclusive findings on diagnostic imaging of breast: Secondary | ICD-10-CM

## 2021-08-17 NOTE — Telephone Encounter (Signed)
Called pt, left vm with navigation resources and provided contact information. Request return call with questions or needs. Regarding tx care plan or next steps.

## 2021-08-19 ENCOUNTER — Other Ambulatory Visit: Payer: BC Managed Care – PPO

## 2021-08-21 ENCOUNTER — Encounter: Payer: Self-pay | Admitting: *Deleted

## 2021-08-21 ENCOUNTER — Inpatient Hospital Stay
Admission: RE | Admit: 2021-08-21 | Discharge: 2021-08-21 | Disposition: A | Discharge status: Home / Self Care | Payer: Self-pay | Source: Ambulatory Visit | Attending: Radiation Oncology | Admitting: Radiation Oncology

## 2021-08-21 ENCOUNTER — Other Ambulatory Visit: Payer: Self-pay | Admitting: Radiation Oncology

## 2021-08-21 DIAGNOSIS — C50912 Malignant neoplasm of unspecified site of left female breast: Secondary | ICD-10-CM

## 2021-08-31 ENCOUNTER — Ambulatory Visit
Admission: RE | Admit: 2021-08-31 | Discharge: 2021-08-31 | Disposition: A | Discharge status: Home / Self Care | Payer: BC Managed Care – PPO | Source: Ambulatory Visit | Attending: Hematology | Admitting: Hematology

## 2021-08-31 DIAGNOSIS — R928 Other abnormal and inconclusive findings on diagnostic imaging of breast: Secondary | ICD-10-CM

## 2021-09-03 ENCOUNTER — Other Ambulatory Visit: Payer: Self-pay | Admitting: Hematology

## 2021-09-03 ENCOUNTER — Encounter: Payer: Self-pay | Admitting: *Deleted

## 2021-09-03 ENCOUNTER — Ambulatory Visit
Admission: RE | Admit: 2021-09-03 | Discharge: 2021-09-03 | Disposition: A | Discharge status: Home / Self Care | Payer: BC Managed Care – PPO | Source: Ambulatory Visit | Attending: Hematology | Admitting: Hematology

## 2021-09-03 ENCOUNTER — Other Ambulatory Visit: Payer: Self-pay

## 2021-09-03 DIAGNOSIS — K769 Liver disease, unspecified: Secondary | ICD-10-CM

## 2021-09-03 DIAGNOSIS — R9389 Abnormal findings on diagnostic imaging of other specified body structures: Secondary | ICD-10-CM

## 2021-09-03 MED ORDER — GADOBENATE DIMEGLUMINE 529 MG/ML IV SOLN
17.0000 mL | Freq: Once | INTRAVENOUS | Status: AC | PRN
  Administered 2021-09-03: 17 mL via INTRAVENOUS

## 2021-09-04 ENCOUNTER — Telehealth: Payer: Self-pay | Admitting: Hematology

## 2021-09-04 ENCOUNTER — Other Ambulatory Visit: Payer: Self-pay | Admitting: Surgery

## 2021-09-04 ENCOUNTER — Encounter: Payer: Self-pay | Admitting: *Deleted

## 2021-09-04 NOTE — Telephone Encounter (Signed)
I called pt and discussed her benign liver MRI findings, no evidence of malignancy in liver, she appreciated the call.   Truitt Merle  09/04/2021

## 2021-09-06 ENCOUNTER — Other Ambulatory Visit: Payer: Self-pay

## 2021-09-06 ENCOUNTER — Ambulatory Visit: Payer: BC Managed Care – PPO | Admitting: Plastic Surgery

## 2021-09-06 ENCOUNTER — Encounter: Payer: Self-pay | Admitting: *Deleted

## 2021-09-06 VITALS — BP 137/79 | HR 65 | Ht 66.0 in | Wt 192.4 lb

## 2021-09-06 DIAGNOSIS — C50912 Malignant neoplasm of unspecified site of left female breast: Secondary | ICD-10-CM

## 2021-09-06 NOTE — Progress Notes (Signed)
Referring Provider [place] Nation, MD [place],  [place] [address]   CC:  Chief Complaint  Patient presents with   Advice Only      Carmen Gordon is an 61 y.o. female.  HPI: Patient presents to discuss options for breast reconstruction.  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  Now appears that the concerning areas on the left are more diffuse than originally thought and it seems like a mastectomy would be a better option for her.  She also has a suspicious area on the right side that still needs to be biopsied.  She has met with Dr. Ninfa Linden as her breast surgeon but still needs to discuss further details about the mastectomy and what to do with the right side.  She has no previous breast procedures.  She does not smoke and is not a diabetic.  No Known Allergies  Outpatient Encounter Medications as of 09/06/2021  Medication Sig   buPROPion (WELLBUTRIN SR) 150 MG 12 hr tablet Take 150 mg by mouth 2 (two) times daily.   FLUoxetine (PROZAC) 20 MG capsule Take by mouth.   levothyroxine (SYNTHROID) 75 MCG tablet Take 75 mcg by mouth daily.   [DISCONTINUED] ibuprofen (ADVIL) 200 MG tablet Take by mouth.   No facility-administered encounter medications on file as of 09/06/2021.     Past Medical History:  Diagnosis Date   Anxiety 2010   Breast cancer ([place]) 07/20/2021   Depression 2010   Hypothyroidism (acquired)    Thyroid disease 2010    Past Surgical History:  Procedure Laterality Date   ABDOMINAL HYSTERECTOMY  2001   foot surgery      Family History  Problem Relation Age of Onset   Melanoma Maternal Uncle    COPD Mother    Heart disease Father     Social History   Social History Narrative   Not on file     Review of Systems General: Denies fevers, chills, weight loss CV: Denies chest pain, shortness of breath, palpitations  Physical Exam Vitals with BMI 09/06/2021 08/16/2021 08/09/2021  Height _0  _1   _2   Weight 192 lbs 6 oz 181 lbs 5 oz 179 lbs 8 oz  BMI 31.07 83.66 29.47  Systolic 654 650 354  Diastolic 79 56 80  Pulse 65 79 66    General:  No acute distress,  Alert and oriented, Non-Toxic, Normal speech and affect Breast: She has very mild ptosis.  Base width 12 cm.  Probably around a D cup in size.  She is reasonably symmetric.  No obvious scars.  Assessment/Plan A long discussion with the patient about her options.  I do think she would be a reasonable candidate for immediate reconstruction.  We focused our discussion on implant-based reconstruction.  We discussed the fact that while she may be a candidate for nipple sparing mastectomy from a reconstructive standpoint we would have to discuss with Dr. Ninfa Linden whether or not she would be a candidate from an oncologic standpoint.  On the right side she is considering a bilateral mastectomy but may be swayed by the results of the biopsy that is still pending.  I did mention that symmetry would probably be better if we did the same procedure to both sides but it comes at the expense of additional surgery which does carry some risk.  We reviewed the stage nature of implant based reconstruction and I would anticipate  placing a tissue expander at the time of her mastectomy in the prepectoral plane followed by switching that out to a gel implant down the line.  We discussed risks include bleeding, infection, damage to surrounding structures need for additional procedures.  We discussed the potential for wound healing complications to compromise the success of the reconstruction.  We discussed the need for drains postoperatively.  All of her questions were answered and we will plan to be available to help with reconstruction moving forward.  Cindra Presume 09/06/2021, 5:01 PM

## 2021-09-07 ENCOUNTER — Ambulatory Visit
Admission: RE | Admit: 2021-09-07 | Discharge: 2021-09-07 | Disposition: A | Discharge status: Home / Self Care | Payer: BC Managed Care – PPO | Source: Ambulatory Visit | Attending: Hematology | Admitting: Hematology

## 2021-09-07 ENCOUNTER — Other Ambulatory Visit (HOSPITAL_COMMUNITY): Payer: Self-pay | Admitting: Diagnostic Radiology

## 2021-09-07 DIAGNOSIS — R9389 Abnormal findings on diagnostic imaging of other specified body structures: Secondary | ICD-10-CM

## 2021-09-07 MED ORDER — GADOBUTROL 1 MMOL/ML IV SOLN
9.0000 mL | Freq: Once | INTRAVENOUS | Status: AC | PRN
  Administered 2021-09-07: 9 mL via INTRAVENOUS

## 2021-09-10 ENCOUNTER — Encounter: Payer: Self-pay | Admitting: *Deleted

## 2021-09-10 ENCOUNTER — Other Ambulatory Visit: Payer: Self-pay | Admitting: Surgery

## 2021-09-11 ENCOUNTER — Telehealth: Payer: Self-pay

## 2021-09-11 ENCOUNTER — Encounter: Payer: Self-pay | Admitting: *Deleted

## 2021-09-11 NOTE — Telephone Encounter (Signed)
Faxed patient information to [facility]. Received confirmation.

## 2021-09-12 ENCOUNTER — Telehealth: Payer: Self-pay | Admitting: Hematology

## 2021-09-12 ENCOUNTER — Telehealth: Payer: Self-pay

## 2021-09-12 NOTE — Telephone Encounter (Signed)
Carmen Gordon from [facility] called regarding demographic information for the patient, Carmen Gordon stated she needs a phone number in order to reach the patient. I called Carmen Gordon and was unable to get her on the phone I left a voicemail for her to give [place] a call back to provide requested information. call [phone]

## 2021-09-12 NOTE — Telephone Encounter (Signed)
Sch per 2/7 inbasket, left pt message

## 2021-09-13 NOTE — Telephone Encounter (Signed)
Called [place], na, not able to leave vm. Faxed over snapshot to her with pt's phone # attached.

## 2021-10-01 NOTE — Progress Notes (Signed)
? ?  Patient ID: Carmen Gordon, female    DOB: Dec 15, 1960, 61 y.o.   MRN: [id number] ? ?Chief Complaint  ?Patient presents with  ? Pre-op Exam  ? ? ?  ICD-10-CM   ?1. Ductal carcinoma of left breast ([facility])  C50.912   ?  ? ? ? ?History of Present Illness: ?Carmen Gordon is a 61 y.o.  female  with a history of left breast DCIS.  She presents for preoperative evaluation for upcoming procedure, bilateral nipple sparing mastectomy with immediate reconstruction using tissue expanders and Flex HD, scheduled for 10/15/2021 with Dr. Ninfa Linden and Dr. Claudia Desanctis. ? ?The patient has not had problems with anesthesia.  Known left-sided DCIS, but not on any hormone therapy.  Denies history of chemotherapy or radiation.  She does not have a port.  No personal or family history of blood clots or clotting disorder.  She does not smoke tobacco.  She is a 36 C cup and would like to remain the same after her reconstruction.  She is not on any blood thinners.  No personal history of MI or CVA. ? ?Summary of Previous Visit: Patient was seen for consult by Dr. Claudia Desanctis on 09/06/2021.  Discussed biopsy-proven DCIS left breast and multiple other concerning areas in each breast.  After discussion with her general surgery, decided on NSM.  Discussed reconstruction using tissue expanders and Flex HD.  Discussed prepectoral plane for the expander placement and implant exchange in a few months after expansion in office.  Risks discussed as well as the necessity for drains postoperatively.  Patient expressed understanding and was agreeable to the plan. ? ?Job: Pharmacist, hospital, FMLA through general surgery. ? ?PMH Significant for: DCIS left breast, thyroid disorder, anxiety. ? ? ?Past Medical History: ?Allergies: ?No Known Allergies ? ?Current Medications: ? ?Current Outpatient Medications:  ?  buPROPion (WELLBUTRIN SR) 150 MG 12 hr tablet, Take 150 mg by mouth 2 (two) times daily., Disp: , Rfl:  ?  FLUoxetine (PROZAC) 20 MG capsule, Take by mouth every other  day., Disp: , Rfl:  ?  levothyroxine (SYNTHROID) 75 MCG tablet, Take 75 mcg by mouth daily., Disp: , Rfl:  ? ?Past Medical Problems: ?Past Medical History:  ?Diagnosis Date  ? Anxiety 2010  ? Breast cancer ([facility]) 07/20/2021  ? Depression 2010  ? Hypothyroidism (acquired)   ? Thyroid disease 2010  ? ? ?Past Surgical History: ?Past Surgical History:  ?Procedure Laterality Date  ? ABDOMINAL HYSTERECTOMY  2001  ? foot surgery    ? ? ?Social History: ?Social History  ? ?Socioeconomic History  ? Marital status: Married  ?  Spouse name: Not on file  ? Number of children: 2  ? Years of education: Not on file  ? Highest education level: Not on file  ?Occupational History  ? Not on file  ?Tobacco Use  ? Smoking status: Never  ? Smokeless tobacco: Never  ? Tobacco comments:  ?  Father smoked  ?Substance and Sexual Activity  ? Alcohol use: Never  ? Drug use: Never  ? Sexual activity: Yes  ?  Birth control/protection: Post-menopausal  ?Other Topics Concern  ? Not on file  ?Social History Narrative  ? Not on file  ? ?Social Determinants of Health  ? ?Financial Resource Strain: Not on file  ?Food Insecurity: Not on file  ?Transportation Needs: Not on file  ?Physical Activity: Not on file  ?Stress: Not on file  ?Social Connections: Not on file  ?Intimate Partner Violence: Not on file  ? ? ?  Family History: ?Family History  ?Problem Relation Age of Onset  ? Melanoma Maternal Uncle   ? COPD Mother   ? Heart disease Father   ? ? ?Review of Systems: ?ROS ?Denies recent illness or infection, traumas, hospitalizations, chest pain or difficulty breathing. ? ?Physical Exam: ?Vital Signs ?BP 136/62 (BP Location: Left Arm, Patient Position: Sitting, Cuff Size: Large)   Pulse 68   Ht 5\' 6"  (1.676 m)   Wt 183 lb 6.4 oz (83.2 kg)   SpO2 98%   BMI 29.60 kg/m?  ? ?Physical Exam ?Constitutional:   ?   General: Not in acute distress. ?   Appearance: Normal appearance. Not ill-appearing.  ?HENT:  ?   Head: Normocephalic and atraumatic.  ?Eyes:  ?    Pupils: Pupils are equal, round. ?Cardiovascular:  ?   Rate and Rhythm: Normal rate. ?   Pulses: Normal pulses.  ?Pulmonary:  ?   Effort: No respiratory distress or increased work of breathing.  Speaks in full sentences. ?Abdominal:  ?   General: Abdomen is flat. No distension.   ?Musculoskeletal: Normal range of motion. No lower extremity swelling or edema. No varicosities. ?Skin: ?   General: Skin is warm and dry.  ?   Findings: No erythema or rash.  ?Neurological:  ?   Mental Status: Alert and oriented to person, place, and time.  ?Psychiatric:     ?   Mood and Affect: Mood normal.     ?   Behavior: Behavior normal.  ? ? ?Assessment/Plan: ?The patient is scheduled for bilateral NSM with immediate reconstruction using tissue expanders and Flex HD with Dr. Claudia Desanctis.  Risks, benefits, and alternatives of procedure discussed, questions answered and consent obtained.   ? ?Smoking Status: Non-smoker. ?Last Mammogram: 08/2021; Results: BI-RADS category 4: Suspicious.  Biopsy-proven DCIS left breast.  ? ?Caprini Score: 6; Risk Factors include: Age, BMI greater than 25, breast cancer, and length of planned surgery. Recommendation for mechanical prophylaxis. Encourage early ambulation.  ? ?Pictures obtained: Today. ? ?Post-op Rx sent to pharmacy: Oxycodone, Zofran, Bactrim. ? ?Patient was provided with the General Surgical Risk consent document and Pain Medication Agreement prior to their appointment.  They had adequate time to read through the risk consent documents and Pain Medication Agreement. We also discussed them in person together during this preop appointment. All of their questions were answered to their satisfaction.  Recommended calling if they have any further questions.  Risk consent form and Pain Medication Agreement to be scanned into patient's chart. ? ?The risks that can be encountered with and after placement of a breast expander placement were discussed and include the following but not limited to  these: ?bleeding, infection, delayed healing, anesthesia risks, skin sensation changes, injury to structures including nerves, blood vessels, and muscles which may be temporary or permanent, allergies to tape, suture materials and glues, blood products, topical preparations or injected agents, skin contour irregularities, skin discoloration and swelling, deep vein thrombosis, cardiac and pulmonary complications, pain, which may persist, fluid accumulation, wrinkling of the skin over the expander, changes in nipple or breast sensation, expander leakage or rupture, faulty position of the expander, persistent pain, formation of tight scar tissue around the expander (capsular contracture), possible need for revisional surgery or staged procedures. ? ? ? ?Electronically signed by: Krista Blue, PA-C 10/03/2021 4:07 PM ?

## 2021-10-03 ENCOUNTER — Other Ambulatory Visit: Payer: Self-pay

## 2021-10-03 ENCOUNTER — Ambulatory Visit (INDEPENDENT_AMBULATORY_CARE_PROVIDER_SITE_OTHER): Payer: BC Managed Care – PPO | Admitting: Physician Assistant

## 2021-10-03 ENCOUNTER — Encounter: Payer: Self-pay | Admitting: Physician Assistant

## 2021-10-03 VITALS — BP 136/62 | HR 68 | Ht 66.0 in | Wt 183.4 lb

## 2021-10-03 DIAGNOSIS — C50912 Malignant neoplasm of unspecified site of left female breast: Secondary | ICD-10-CM

## 2021-10-03 MED ORDER — OXYCODONE HCL 5 MG PO TABS: 5.0000 mg | ORAL_TABLET | Freq: Four times a day (QID) | ORAL | 0 refills | Status: AC | PRN

## 2021-10-03 MED ORDER — SULFAMETHOXAZOLE-TRIMETHOPRIM 800-160 MG PO TABS: 1.0000 | ORAL_TABLET | Freq: Two times a day (BID) | ORAL | 0 refills | Status: AC

## 2021-10-03 MED ORDER — ONDANSETRON 4 MG PO TBDP: 4.0000 mg | ORAL_TABLET | Freq: Three times a day (TID) | ORAL | 0 refills | Status: DC | PRN

## 2021-10-05 ENCOUNTER — Encounter (HOSPITAL_BASED_OUTPATIENT_CLINIC_OR_DEPARTMENT_OTHER): Payer: Self-pay | Admitting: Surgery

## 2021-10-05 ENCOUNTER — Other Ambulatory Visit: Payer: Self-pay

## 2021-10-11 NOTE — Progress Notes (Signed)

## 2021-10-14 NOTE — H&P (Signed)
?REFERRING PHYSICIAN: Urbano Heir,* ? ?PROVIDER: Beverlee Nims, MD ? ?MRN: [id number] ?DOB: [birth year] ? ?Subjective  ? ?Chief Complaint: New Consultation (Left Breast Cancer) ? ? ?History of Present Illness: ?Carmen Gordon is a 61 y.o. female who is seen t as an office consultation at the request of Dr. Jimmye Norman for evaluation of New Consultation (Left Breast Cancer) ?.  ? ?This is a pleasant 61 year old female who is referred here for the recent diagnosis of ductal carcinoma of the left breast. Screening mammography showed an increase of calcifications in the left breast. She underwent 2 separate biopsies both of which showed intermediate grade ductal carcinoma in situ. There were 100% ER and 60% PR positive. She has had no previous problems regarding her breast except cyst. She is otherwise healthy and without complaints. Denies nipple discharge. She has no family history of breast cancer ? ?Review of Systems: ?A complete review of systems was obtained from the patient. I have reviewed this information and discussed as appropriate with the patient. See HPI as well for other ROS. ? ?ROS  ? ?Medical History: ?Past Medical History:  ?Diagnosis Date  ? Anxiety  ? Thyroid disease  ? ?Patient Active Problem List  ?Diagnosis  ? Anxiety  ? Hypothyroidism  ? Obstruction of left eustachian tube  ? ?Past Surgical History:  ?Procedure Laterality Date  ? HYSTERECTOMY 2003  ? ? ?No Known Allergies ? ?Current Outpatient Medications on File Prior to Visit  ?Medication Sig Dispense Refill  ? buPROPion (WELLBUTRIN SR) 150 MG SR tablet Take by mouth  ? FLUoxetine (PROZAC) 20 MG capsule  ? levothyroxine (SYNTHROID) 50 MCG tablet Take 1 tablet by mouth once daily  ? ?No current facility-administered medications on file prior to visit.  ? ?History reviewed. No pertinent family history.  ? ?Social History  ? ?Tobacco Use  ?Smoking Status Never  ?Smokeless Tobacco Never  ? ? ?Social History  ? ?Socioeconomic History   ? Marital status: Married  ?Tobacco Use  ? Smoking status: Never  ? Smokeless tobacco: Never  ?Vaping Use  ? Vaping Use: Unknown  ?Substance and Sexual Activity  ? Alcohol use: Yes  ? Drug use: Never  ? ?Objective:  ? ?Vitals:  ?07/31/21 0929  ?BP: 132/80  ?Pulse: 65  ?Temp: 36.2 ?C (97.1 ?F)  ?SpO2: 98%  ?Weight: 82.7 kg (182 lb 6.4 oz)  ?Height: 167.6 cm ('5\' 6"'$ )  ? ?Body mass index is 29.44 kg/m?. ? ?Physical Exam  ? ?She appears well on exam ? ?There is some ecchymosis in the left breast, hematoma from the biopsies but no other palpable masses or abnormalities. There is no axillary adenopathy. The nipple areolar complex is normal ? ?Labs, Imaging and Diagnostic Testing: ?I have reviewed her mammograms, ultrasound, and pathology results ? ?Assessment and Plan:  ? ?Diagnoses and all orders for this visit: ? ?Ductal carcinoma in situ (DCIS) of left breast ?- MRI breast bilateral with and without contrast ?- Ambulatory Referral to Oncology-Medical ?- Ambulatory Referral to Radiation Oncology ? ? ? ?I reviewed her notes and electronic medical records. I had a long discussion with the patient and her husband regarding her diagnosis of ductal carcinoma in situ of the breast. I gave him a copy of the pathology results. From a surgical standpoint we discussed options which include breast conservation with a radioactive seed guided lumpectomy versus mastectomy these. Given the large area occasions, breast MRI is recommended to evaluate the extent of the disease preoperatively to see if she  is still a candidate for breast conservation. At the current size, I believe she would still be a candidate for a lumpectomy. ?She will be referred to medical and radiation oncology as well. I will call back with results of the MRI we will determine how to proceed further. She and her husband agreed with plan.  ? ?Addendum:  The MRI showed the area of DCIS in the left breast measures 7 cm.  A suspicious area in the right breast was  found on MRI and eventually biopsied showing a benign fibroadenoma. There where small masses in the liver prompting a liver MRI that showed these where likely benign. ?Given the extent of the DCIS, mastectomy was now recommended.  She was referred to plastic surgery and was found to be a candidate for bilateral nipple sparing mastectomies. We have discussed the risks which include but are not limited to bleeding, infection, nipple necrosis, the need for evaluation of her lymph nodes if invasive cancer is found in the breasts, cardiopulmonary issues, etc.  Surgery is scheduled. ?

## 2021-10-15 ENCOUNTER — Encounter (HOSPITAL_BASED_OUTPATIENT_CLINIC_OR_DEPARTMENT_OTHER): Payer: Self-pay | Admitting: Surgery

## 2021-10-15 ENCOUNTER — Other Ambulatory Visit: Payer: Self-pay

## 2021-10-15 ENCOUNTER — Encounter (HOSPITAL_BASED_OUTPATIENT_CLINIC_OR_DEPARTMENT_OTHER)
Admission: RE | Disposition: A | Discharge status: Home / Self Care | Payer: Self-pay | Source: Home / Self Care | Attending: Plastic Surgery

## 2021-10-15 ENCOUNTER — Observation Stay (HOSPITAL_BASED_OUTPATIENT_CLINIC_OR_DEPARTMENT_OTHER)
Admission: RE | Admit: 2021-10-15 | Discharge: 2021-10-16 | Disposition: A | Discharge status: Home / Self Care | Payer: BC Managed Care – PPO | Attending: Plastic Surgery | Admitting: Plastic Surgery

## 2021-10-15 ENCOUNTER — Ambulatory Visit (HOSPITAL_BASED_OUTPATIENT_CLINIC_OR_DEPARTMENT_OTHER): Payer: BC Managed Care – PPO | Admitting: Anesthesiology

## 2021-10-15 DIAGNOSIS — E039 Hypothyroidism, unspecified: Secondary | ICD-10-CM | POA: Insufficient documentation

## 2021-10-15 DIAGNOSIS — D0512 Intraductal carcinoma in situ of left breast: Principal | ICD-10-CM | POA: Insufficient documentation

## 2021-10-15 DIAGNOSIS — Z79899 Other long term (current) drug therapy: Secondary | ICD-10-CM | POA: Diagnosis not present

## 2021-10-15 DIAGNOSIS — N6081 Other benign mammary dysplasias of right breast: Secondary | ICD-10-CM | POA: Diagnosis not present

## 2021-10-15 DIAGNOSIS — N641 Fat necrosis of breast: Secondary | ICD-10-CM | POA: Diagnosis not present

## 2021-10-15 DIAGNOSIS — C50912 Malignant neoplasm of unspecified site of left female breast: Secondary | ICD-10-CM

## 2021-10-15 DIAGNOSIS — Z9889 Other specified postprocedural states: Secondary | ICD-10-CM

## 2021-10-15 HISTORY — DX: Other complications of anesthesia, initial encounter: T88.59XA

## 2021-10-15 HISTORY — DX: Nausea with vomiting, unspecified: R11.2

## 2021-10-15 HISTORY — DX: Other specified postprocedural states: Z98.890

## 2021-10-15 HISTORY — PX: BREAST RECONSTRUCTION WITH PLACEMENT OF TISSUE EXPANDER AND FLEX HD (ACELLULAR HYDRATED DERMIS): SHX6295

## 2021-10-15 HISTORY — PX: NIPPLE SPARING MASTECTOMY: SHX6537

## 2021-10-15 SURGERY — MASTECTOMY, NIPPLE SPARING
Anesthesia: General | Site: Breast | Laterality: Bilateral

## 2021-10-15 MED ORDER — METHOCARBAMOL 500 MG PO TABS
500.0000 mg | ORAL_TABLET | Freq: Four times a day (QID) | ORAL | Status: DC | PRN
  Administered 2021-10-15 (×2): 500 mg via ORAL
  Filled 2021-10-15 (×2): qty 1

## 2021-10-15 MED ORDER — SULFAMETHOXAZOLE-TRIMETHOPRIM 800-160 MG PO TABS
1.0000 | ORAL_TABLET | Freq: Two times a day (BID) | ORAL | Status: DC
  Administered 2021-10-15: 1 via ORAL
  Filled 2021-10-15 (×2): qty 1

## 2021-10-15 MED ORDER — ONDANSETRON HCL 4 MG/2ML IJ SOLN
INTRAMUSCULAR | Status: DC | PRN
  Administered 2021-10-15: 4 mg via INTRAVENOUS

## 2021-10-15 MED ORDER — EPINEPHRINE PF 1 MG/ML IJ SOLN
INTRAMUSCULAR | Status: AC
  Filled 2021-10-15: qty 1

## 2021-10-15 MED ORDER — SCOPOLAMINE 1 MG/3DAYS TD PT72
MEDICATED_PATCH | TRANSDERMAL | Status: AC
  Filled 2021-10-15: qty 1

## 2021-10-15 MED ORDER — MIDAZOLAM HCL 2 MG/2ML IJ SOLN
INTRAMUSCULAR | Status: AC
  Filled 2021-10-15: qty 2

## 2021-10-15 MED ORDER — BUPIVACAINE-EPINEPHRINE (PF) 0.5% -1:200000 IJ SOLN
INTRAMUSCULAR | Status: AC
  Filled 2021-10-15: qty 30

## 2021-10-15 MED ORDER — PROPOFOL 10 MG/ML IV BOLUS
INTRAVENOUS | Status: DC | PRN
Start: 2021-10-15 — End: 2021-10-15
  Administered 2021-10-15: 150 mg via INTRAVENOUS
  Administered 2021-10-15: 20 mg via INTRAVENOUS

## 2021-10-15 MED ORDER — FENTANYL CITRATE (PF) 100 MCG/2ML IJ SOLN
INTRAMUSCULAR | Status: DC | PRN
  Administered 2021-10-15: 25 ug via INTRAVENOUS
  Administered 2021-10-15: 50 ug via INTRAVENOUS
  Administered 2021-10-15 (×2): 25 ug via INTRAVENOUS
  Administered 2021-10-15: 50 ug via INTRAVENOUS
  Administered 2021-10-15 (×3): 25 ug via INTRAVENOUS

## 2021-10-15 MED ORDER — FENTANYL CITRATE (PF) 100 MCG/2ML IJ SOLN
INTRAMUSCULAR | Status: AC
  Filled 2021-10-15: qty 2

## 2021-10-15 MED ORDER — ACETAMINOPHEN 500 MG PO TABS
ORAL_TABLET | ORAL | Status: AC
  Filled 2021-10-15: qty 2

## 2021-10-15 MED ORDER — CHLORHEXIDINE GLUCONATE CLOTH 2 % EX PADS: 6.0000 | MEDICATED_PAD | Freq: Once | CUTANEOUS | Status: DC

## 2021-10-15 MED ORDER — HYDROMORPHONE HCL 1 MG/ML IJ SOLN
INTRAMUSCULAR | Status: AC
  Filled 2021-10-15: qty 0.5

## 2021-10-15 MED ORDER — ACETAMINOPHEN 500 MG PO TABS
1000.0000 mg | ORAL_TABLET | ORAL | Status: AC
  Administered 2021-10-15: 1000 mg via ORAL

## 2021-10-15 MED ORDER — BUPIVACAINE-EPINEPHRINE 0.25% -1:200000 IJ SOLN
INTRAMUSCULAR | Status: DC | PRN
  Administered 2021-10-15: 20 mL

## 2021-10-15 MED ORDER — SCOPOLAMINE 1 MG/3DAYS TD PT72
1.0000 | MEDICATED_PATCH | TRANSDERMAL | Status: DC
  Administered 2021-10-15: 1.5 mg via TRANSDERMAL

## 2021-10-15 MED ORDER — EPHEDRINE SULFATE (PRESSORS) 50 MG/ML IJ SOLN
INTRAMUSCULAR | Status: DC | PRN
  Administered 2021-10-15 (×2): 10 mg via INTRAVENOUS

## 2021-10-15 MED ORDER — CEFAZOLIN SODIUM-DEXTROSE 2-4 GM/100ML-% IV SOLN
INTRAVENOUS | Status: AC
Start: 2021-10-15 — End: ?
  Filled 2021-10-15: qty 100

## 2021-10-15 MED ORDER — SODIUM CHLORIDE 0.9 % IV SOLN: INTRAVENOUS | Status: DC | PRN

## 2021-10-15 MED ORDER — PROPOFOL 10 MG/ML IV BOLUS
INTRAVENOUS | Status: AC
  Filled 2021-10-15: qty 20

## 2021-10-15 MED ORDER — HYDROMORPHONE HCL 1 MG/ML IJ SOLN
0.2500 mg | INTRAMUSCULAR | Status: DC | PRN
  Administered 2021-10-15 (×4): 0.5 mg via INTRAVENOUS

## 2021-10-15 MED ORDER — MORPHINE SULFATE (PF) 4 MG/ML IV SOLN: 1.0000 mg | INTRAVENOUS | Status: DC | PRN

## 2021-10-15 MED ORDER — LACTATED RINGERS IV SOLN
INTRAVENOUS | Status: DC
  Administered 2021-10-15: 10 mL/h via INTRAVENOUS

## 2021-10-15 MED ORDER — MAGTRACE LYMPHATIC TRACER
INTRAMUSCULAR | Status: DC | PRN
  Administered 2021-10-15: 2 mL via INTRAMUSCULAR

## 2021-10-15 MED ORDER — BUPIVACAINE-EPINEPHRINE (PF) 0.25% -1:200000 IJ SOLN
INTRAMUSCULAR | Status: AC
  Filled 2021-10-15: qty 30

## 2021-10-15 MED ORDER — DEXAMETHASONE SODIUM PHOSPHATE 4 MG/ML IJ SOLN
INTRAMUSCULAR | Status: DC | PRN
  Administered 2021-10-15: 10 mg via INTRAVENOUS

## 2021-10-15 MED ORDER — MIDAZOLAM HCL 5 MG/5ML IJ SOLN
INTRAMUSCULAR | Status: DC | PRN
  Administered 2021-10-15: 2 mg via INTRAVENOUS

## 2021-10-15 MED ORDER — LIDOCAINE HCL (CARDIAC) PF 100 MG/5ML IV SOSY
PREFILLED_SYRINGE | INTRAVENOUS | Status: DC | PRN
  Administered 2021-10-15: 60 mg via INTRAVENOUS

## 2021-10-15 MED ORDER — PROPOFOL 500 MG/50ML IV EMUL
INTRAVENOUS | Status: DC | PRN
  Administered 2021-10-15: 25 ug/kg/min via INTRAVENOUS

## 2021-10-15 MED ORDER — BUPIVACAINE HCL (PF) 0.25 % IJ SOLN
INTRAMUSCULAR | Status: AC
  Filled 2021-10-15: qty 30

## 2021-10-15 MED ORDER — ALUM & MAG HYDROXIDE-SIMETH 200-200-20 MG/5ML PO SUSP
30.0000 mL | Freq: Three times a day (TID) | ORAL | Status: DC | PRN
  Administered 2021-10-15: 30 mL via ORAL
  Filled 2021-10-15: qty 30

## 2021-10-15 MED ORDER — ONDANSETRON HCL 4 MG/2ML IJ SOLN
4.0000 mg | Freq: Four times a day (QID) | INTRAMUSCULAR | Status: DC | PRN
  Administered 2021-10-15: 4 mg via INTRAVENOUS
  Filled 2021-10-15: qty 2

## 2021-10-15 MED ORDER — OXYCODONE HCL 5 MG PO TABS
5.0000 mg | ORAL_TABLET | ORAL | Status: DC | PRN
  Administered 2021-10-15 (×2): 10 mg via ORAL
  Administered 2021-10-16 (×2): 5 mg via ORAL
  Filled 2021-10-15 (×2): qty 2
  Filled 2021-10-15 (×2): qty 1

## 2021-10-15 MED ORDER — CEFAZOLIN SODIUM-DEXTROSE 2-4 GM/100ML-% IV SOLN
2.0000 g | INTRAVENOUS | Status: AC
  Administered 2021-10-15: 2 g via INTRAVENOUS

## 2021-10-15 MED ORDER — FENTANYL CITRATE (PF) 100 MCG/2ML IJ SOLN
INTRAMUSCULAR | Status: AC
Start: 2021-10-15 — End: ?
  Filled 2021-10-15: qty 2

## 2021-10-15 SURGICAL SUPPLY — 88 items
ADH SKN CLS APL DERMABOND .7 (GAUZE/BANDAGES/DRESSINGS) ×4
APL PRP STRL LF DISP 70% ISPRP (MISCELLANEOUS) ×4
APPLIER CLIP 9.375 MED OPEN (MISCELLANEOUS) ×3
APR CLP MED 9.3 20 MLT OPN (MISCELLANEOUS) ×2
BAG DECANTER FOR FLEXI CONT (MISCELLANEOUS) ×3 IMPLANT
BINDER BREAST XLRG (GAUZE/BANDAGES/DRESSINGS) ×1 IMPLANT
BIOPATCH RED 1 DISK 7.0 (GAUZE/BANDAGES/DRESSINGS) ×4 IMPLANT
BLADE SURG 10 STRL SS (BLADE) ×3 IMPLANT
BLADE SURG 15 STRL LF DISP TIS (BLADE) ×4 IMPLANT
BLADE SURG 15 STRL SS (BLADE) ×6
BNDG CMPR MED 10X6 ELC LF (GAUZE/BANDAGES/DRESSINGS) ×2
BNDG ELASTIC 6X10 VLCR STRL LF (GAUZE/BANDAGES/DRESSINGS) ×3 IMPLANT
CANISTER SUCT 1200ML W/VALVE (MISCELLANEOUS) ×3 IMPLANT
CHLORAPREP W/TINT 26 (MISCELLANEOUS) ×6 IMPLANT
CLIP APPLIE 9.375 MED OPEN (MISCELLANEOUS) ×2 IMPLANT
COVER BACK TABLE 60X90IN (DRAPES) ×3 IMPLANT
COVER MAYO STAND STRL (DRAPES) ×3 IMPLANT
DERMABOND ADVANCED (GAUZE/BANDAGES/DRESSINGS) ×2
DERMABOND ADVANCED .7 DNX12 (GAUZE/BANDAGES/DRESSINGS) ×2 IMPLANT
DRAIN CHANNEL 15F RND FF W/TCR (WOUND CARE) ×4 IMPLANT
DRAPE LAPAROSCOPIC ABDOMINAL (DRAPES) ×3 IMPLANT
DRAPE TOP ARMCOVERS (MISCELLANEOUS) ×3 IMPLANT
DRAPE UTILITY XL STRL (DRAPES) ×3 IMPLANT
DRSG PAD ABDOMINAL 8X10 ST (GAUZE/BANDAGES/DRESSINGS) ×6 IMPLANT
DRSG TEGADERM 2-3/8X2-3/4 SM (GAUZE/BANDAGES/DRESSINGS) ×4 IMPLANT
DRSG TEGADERM 4X10 (GAUZE/BANDAGES/DRESSINGS) ×2 IMPLANT
ELECT BLADE 4.0 EZ CLEAN MEGAD (MISCELLANEOUS)
ELECT COATED BLADE 2.86 ST (ELECTRODE) IMPLANT
ELECT REM PT RETURN 9FT ADLT (ELECTROSURGICAL) ×3
ELECTRODE BLDE 4.0 EZ CLN MEGD (MISCELLANEOUS) IMPLANT
ELECTRODE REM PT RTRN 9FT ADLT (ELECTROSURGICAL) ×2 IMPLANT
EVACUATOR SILICONE 100CC (DRAIN) ×4 IMPLANT
GAUZE SPONGE 4X4 12PLY STRL (GAUZE/BANDAGES/DRESSINGS) ×3 IMPLANT
GAUZE SPONGE 4X4 12PLY STRL LF (GAUZE/BANDAGES/DRESSINGS) ×12 IMPLANT
GLOVE SRG 8 PF TXTR STRL LF DI (GLOVE) IMPLANT
GLOVE SURG ENC MOIS LTX SZ7 (GLOVE) ×3 IMPLANT
GLOVE SURG ENC TEXT LTX SZ7.5 (GLOVE) ×6 IMPLANT
GLOVE SURG SIGNA 7.5 PF LTX (GLOVE) ×3 IMPLANT
GLOVE SURG UNDER POLY LF SZ8 (GLOVE)
GOWN STRL REUS W/ TWL LRG LVL3 (GOWN DISPOSABLE) ×6 IMPLANT
GOWN STRL REUS W/ TWL XL LVL3 (GOWN DISPOSABLE) ×2 IMPLANT
GOWN STRL REUS W/TWL LRG LVL3 (GOWN DISPOSABLE) ×9
GOWN STRL REUS W/TWL XL LVL3 (GOWN DISPOSABLE) ×3
GRAFT FLEX HD 19X22X0.7-1.4 (Tissue) ×2 IMPLANT
HEMOSTAT SURGICEL 2X14 (HEMOSTASIS) IMPLANT
IMPL EXPANDER BREAST 535CC (Breast) IMPLANT
IMPLANT BREAST 535CC (Breast) ×2 IMPLANT
IMPLANT EXPANDER BREAST 535CC (Breast) ×4 IMPLANT
IV NS 500ML (IV SOLUTION)
IV NS 500ML BAXH (IV SOLUTION) IMPLANT
KIT FILL SYSTEM UNIVERSAL (SET/KITS/TRAYS/PACK) ×1 IMPLANT
MARKER SKIN DUAL TIP RULER LAB (MISCELLANEOUS) ×1 IMPLANT
NDL HYPO 25X1 1.5 SAFETY (NEEDLE) IMPLANT
NEEDLE HYPO 25X1 1.5 SAFETY (NEEDLE) ×3 IMPLANT
NS IRRIG 1000ML POUR BTL (IV SOLUTION) ×3 IMPLANT
PACK BASIN DAY SURGERY FS (CUSTOM PROCEDURE TRAY) ×3 IMPLANT
PACK SPY-PHI (KITS) ×1 IMPLANT
PENCIL SMOKE EVACUATOR (MISCELLANEOUS) ×3 IMPLANT
PIN SAFETY STERILE (MISCELLANEOUS) ×2 IMPLANT
RETRACTOR ONETRAX LX 90X20 (MISCELLANEOUS) ×1 IMPLANT
SHEET MEDIUM DRAPE 40X70 STRL (DRAPES) ×6 IMPLANT
SLEEVE SCD COMPRESS KNEE MED (STOCKING) ×3 IMPLANT
SPONGE T-LAP 18X18 ~~LOC~~+RFID (SPONGE) ×9 IMPLANT
STAPLER INSORB 30 2030 C-SECTI (MISCELLANEOUS) ×3 IMPLANT
STAPLER VISISTAT 35W (STAPLE) IMPLANT
STRIP CLOSURE SKIN 1/2X4 (GAUZE/BANDAGES/DRESSINGS) ×6 IMPLANT
STRIP SUTURE WOUND CLOSURE 1/2 (MISCELLANEOUS) ×1 IMPLANT
SUT ETHILON 2 0 FS 18 (SUTURE) ×4 IMPLANT
SUT ETHILON 3 0 PS 1 (SUTURE) ×5 IMPLANT
SUT MNCRL AB 4-0 PS2 18 (SUTURE) ×4 IMPLANT
SUT MON AB 3-0 SH 27 (SUTURE)
SUT MON AB 3-0 SH27 (SUTURE) IMPLANT
SUT MON AB 4-0 PC3 18 (SUTURE) ×4 IMPLANT
SUT PDS 3-0 CT2 (SUTURE) ×24
SUT PDS II 3-0 CT2 27 ABS (SUTURE) ×6 IMPLANT
SUT PLAIN 5 0 P 3 18 (SUTURE) IMPLANT
SUT VIC AB 3-0 SH 27 (SUTURE)
SUT VIC AB 3-0 SH 27X BRD (SUTURE) ×4 IMPLANT
SUT VLOC 180 0 24IN GS25 (SUTURE) ×1 IMPLANT
SUT VLOC 90 P-14 23 (SUTURE) ×3 IMPLANT
SYR BULB EAR ULCER 3OZ GRN STR (SYRINGE) ×3 IMPLANT
SYR BULB IRRIG 60ML STRL (SYRINGE) ×3 IMPLANT
SYR CONTROL 10ML LL (SYRINGE) ×1 IMPLANT
TAPE MEASURE VINYL STERILE (MISCELLANEOUS) IMPLANT
TOWEL GREEN STERILE FF (TOWEL DISPOSABLE) ×6 IMPLANT
TUBE CONNECTING 20X1/4 (TUBING) ×3 IMPLANT
UNDERPAD 30X36 HEAVY ABSORB (UNDERPADS AND DIAPERS) ×6 IMPLANT
YANKAUER SUCT BULB TIP NO VENT (SUCTIONS) ×3 IMPLANT

## 2021-10-15 NOTE — Anesthesia Preprocedure Evaluation (Addendum)
Anesthesia Evaluation  ?Patient identified by MRN, date of birth, ID band ?Patient awake ? ? ? ?Reviewed: ?Allergy & Precautions, H&P , NPO status , Patient's Chart, lab work & pertinent test results ? ?History of Anesthesia Complications ?(+) PONV and history of anesthetic complications ? ?Airway ?Mallampati: II ? ?TM Distance: >3 FB ?Neck ROM: Full ? ? ? Dental ?no notable dental hx. ?(+) Teeth Intact, Dental Advisory Given ?  ?Pulmonary ?neg pulmonary ROS,  ?  ?Pulmonary exam normal ?breath sounds clear to auscultation ? ? ? ? ? ? Cardiovascular ?negative cardio ROS ? ? ?Rhythm:Regular Rate:Normal ? ? ?  ?Neuro/Psych ?Anxiety Depression negative neurological ROS ?   ? GI/Hepatic ?negative GI ROS, Neg liver ROS,   ?Endo/Other  ?Hypothyroidism  ? Renal/GU ?negative Renal ROS  ?negative genitourinary ?  ?Musculoskeletal ? ? Abdominal ?  ?Peds ? Hematology ?negative hematology ROS ?(+)   ?Anesthesia Other Findings ? ? Reproductive/Obstetrics ?negative OB ROS ? ?  ? ? ? ? ? ? ? ? ? ? ? ? ? ?  ?  ? ? ? ? ? ? ? ?Anesthesia Physical ?Anesthesia Plan ? ?ASA: 2 ? ?Anesthesia Plan: General  ? ?Post-op Pain Management: Tylenol PO (pre-op)*  ? ?Induction: Intravenous ? ?PONV Risk Score and Plan: 4 or greater and Ondansetron, Dexamethasone, Midazolam and Scopolamine patch - Pre-op ? ?Airway Management Planned: LMA ? ?Additional Equipment:  ? ?Intra-op Plan:  ? ?Post-operative Plan: Extubation in OR ? ?Informed Consent: I have reviewed the patients History and Physical, chart, labs and discussed the procedure including the risks, benefits and alternatives for the proposed anesthesia with the patient or authorized representative who has indicated his/her understanding and acceptance.  ? ? ? ?Dental advisory given ? ?Plan Discussed with: CRNA ? ?Anesthesia Plan Comments:   ? ? ? ? ? ? ?Anesthesia Quick Evaluation ? ?

## 2021-10-15 NOTE — Transfer of Care (Signed)
Immediate Anesthesia Transfer of Care Note ? ?Patient: Carmen Gordon ? ?Procedure(s) Performed: BILATERAL NIPPLE SPARING MASTECTOMY (Bilateral: Breast) ?BREAST RECONSTRUCTION WITH PLACEMENT OF TISSUE EXPANDER AND FLEX HD (ACELLULAR HYDRATED DERMIS) (Bilateral) ? ?Patient Location: PACU ? ?Anesthesia Type:General ? ?Level of Consciousness: sedated ? ?Airway & Oxygen Therapy: Patient Spontanous Breathing and Patient connected to face mask oxygen ? ?Post-op Assessment: Report given to RN and Post -op Vital signs reviewed and stable ? ?Post vital signs: Reviewed and stable ? ?Last Vitals:  ?Vitals Value Taken Time  ?BP 129/81 10/15/21 1322  ?Temp    ?Pulse 95 10/15/21 1324  ?Resp 14 10/15/21 1324  ?SpO2 100 % 10/15/21 1324  ?Vitals shown include unvalidated device data. ? ?Last Pain:  ?Vitals:  ? 10/15/21 0814  ?TempSrc: Oral  ?PainSc: 0-No pain  ?   ? ?  ? ?Complications: No notable events documented. ?

## 2021-10-15 NOTE — Interval H&P Note (Signed)
History and Physical Interval Note: no change in H and P ? ?10/15/2021 ?9:41 AM ? ?Carmen Gordon  has presented today for surgery, with the diagnosis of LEFT BREAST DCIS.  The various methods of treatment have been discussed with the patient and family. After consideration of risks, benefits and other options for treatment, the patient has consented to  Procedure(s): ?BILATERAL NIPPLE SPARING MASTECTOMY (Bilateral) ?BREAST RECONSTRUCTION WITH PLACEMENT OF TISSUE EXPANDER AND FLEX HD (ACELLULAR HYDRATED DERMIS) (Bilateral) as a surgical intervention.  The patient's history has been reviewed, patient examined, no change in status, stable for surgery.  I have reviewed the patient's chart and labs.  Questions were answered to the patient's satisfaction.   ? ? ?Coralie Keens ? ? ?

## 2021-10-15 NOTE — Anesthesia Postprocedure Evaluation (Signed)
Anesthesia Post Note ? ?Patient: Carmen Gordon ? ?Procedure(s) Performed: BILATERAL NIPPLE SPARING MASTECTOMY (Bilateral: Breast) ?BREAST RECONSTRUCTION WITH PLACEMENT OF TISSUE EXPANDER AND FLEX HD (ACELLULAR HYDRATED DERMIS) (Bilateral) ? ?  ? ?Patient location during evaluation: PACU ?Anesthesia Type: General ?Level of consciousness: awake and alert ?Pain management: pain level controlled ?Vital Signs Assessment: post-procedure vital signs reviewed and stable ?Respiratory status: spontaneous breathing, nonlabored ventilation and respiratory function stable ?Cardiovascular status: blood pressure returned to baseline and stable ?Postop Assessment: no apparent nausea or vomiting ?Anesthetic complications: no ? ? ?No notable events documented. ? ?Last Vitals:  ?Vitals:  ? 10/15/21 1415 10/15/21 1425  ?BP: 134/68   ?Pulse: 86 76  ?Resp: 17 15  ?Temp:    ?SpO2: 96% 100%  ?  ?Last Pain:  ?Vitals:  ? 10/15/21 1425  ?TempSrc:   ?PainSc: 5   ? ? ?  ?  ?  ?  ?  ?  ? ?Kortne All,W. EDMOND ? ? ? ? ?

## 2021-10-15 NOTE — Anesthesia Procedure Notes (Signed)
Procedure Name: LMA Insertion ?Date/Time: 10/15/2021 10:13 AM ?Performed by: Maryella Shivers, CRNA ?Pre-anesthesia Checklist: Patient identified, Emergency Drugs available, Suction available and Patient being monitored ?Patient Re-evaluated:Patient Re-evaluated prior to induction ?Oxygen Delivery Method: Circle system utilized ?Preoxygenation: Pre-oxygenation with 100% oxygen ?Induction Type: IV induction ?Ventilation: Mask ventilation without difficulty ?LMA: LMA inserted ?LMA Size: 4.0 ?Number of attempts: 1 ?Airway Equipment and Method: Bite block ?Placement Confirmation: positive ETCO2 ?Tube secured with: Tape ?Dental Injury: Teeth and Oropharynx as per pre-operative assessment  ? ? ? ? ?

## 2021-10-15 NOTE — Op Note (Signed)
BILATERAL NIPPLE SPARING MASTECTOMY  Procedure Note ? ?Carmen Gordon ?10/15/2021 ? ? ?Pre-op Diagnosis: LEFT BREAST DCIS ?    ?Post-op Diagnosis: same ? ?Procedure(s): ?BILATERAL NIPPLE SPARING MASTECTOMY ?INJECTION OF MAGTRACE ? ?Surgeon: Coralie Keens, MD ? ?Assistant: Albin Felling, MD [facility] Resident ? ?Anesthesia: General ? ?Staff:  ?Circulator: Donnita Falls, RN ?Relief Circulator: Izora Ribas, RN ?Relief Scrub: Patric Dykes, RN ?Scrub Person: Lorenza Burton, CST; Doug Sou E ? ?Estimated Blood Loss: Minimal ?              ?Specimens: sent to path ? ?Frozen section underneath the nipple was negative. ? 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  This area measured approximate 5 cm on mammography but on follow-up MRI measured over 7 cm.  The decision was made to proceed with nipple sparing bilateral mastectomies. ? ?Procedure: The patient was brought to the operating room identifies correct patient.  She is placed upon the operating table general anesthesia was induced.  Mag trace was then injected underneath the nipple areolar complex with an 18-gauge needle and the breast was massaged. ?Her chest and breast were then prepped and draped in usual sterile fashion.  We first made an inframammary incision transversely underneath the left breast.  We then dissected down to the chest wall with electrocautery and started raising the breast tissue up off of the pectoralis fascia.  We then started dissecting out the anterior skin flap.  We stayed just underneath the skin and dermis with electrocautery dissecting medially and laterally going toward the nipple areolar complex.  Once we reached the nipple tissue at the base of the nipple was excised with a scalpel and sent to pathology for frozen section.  This showed no evidence of ductal carcinoma in situ.  We then completed  the dissection going underneath the nipple areolar complex and moving toward the upper chest near the clavicle.  We then took dissection down the chest wall.  Then dissecting inferiorly medial to lateral with started taking the breast tissue off the chest wall pectoralis fascia.  We did this toward the level of the axilla.  Small bridging vessel in the axilla was clipped with surgical clips.  We then completed the mastectomy removing the rest of the tissue.  We marked the anterior surface of the specimen with a suture at the area of the nipple.  This was then sent to pathology for evaluation. ?We then made a corresponding inframammary incision transversely underneath the right breast.  Again this was taken down to the level of the chest wall.  We then went ahead and dissected the breast tissue off of the chest wall dissecting both medial and lateral and underneath going up to the level near the upper chest and clavicle.  We then used skin hooks to elevate the skin and started taking the breast tissue off of the anterior skin with the cautery dissecting medial and laterally.  We took this to the nipple areolar complex and were able to stay underneath the nipple and areola.  Once we got past the nipple areolar complex we then took the incision further superiorly and then down to the chest wall.  We then dissected going medial to lateral the rest of the breast tissue off of the chest wall.  1 small buttonhole in the skin was created near the sternum.  We then completed dissection removing  the rest of the breast tissue.  We then achieved hemostasis with the cautery.  We marked the mastectomy specimen anteriorly with a suture.  At this point hemostasis appeared to be achieved. ?Dr. Silverio Lay then presented to the room to perform the reconstructive part of the procedure.  All counts were correct at the end of the procedure currently.  She was tolerating the procedure well. ?        ? ?Coralie Keens  ? ?Date: 10/15/2021   Time: 12:00 PM ? ? ? ?

## 2021-10-15 NOTE — Op Note (Signed)
Operative Note  ? ?DATE OF OPERATION: 10/15/2021 ? ?SURGICAL DEPARTMENT: Plastic Surgery ? ?PREOPERATIVE DIAGNOSES: Left DCIS ? ?POSTOPERATIVE DIAGNOSES:  same ? ?PROCEDURE: 1.  Bilateral breast reconstruction with tissue expander and acellular dermal matrix ?2.  Bilateral indocyanine green angiography of mastectomy flaps ? ?SURGEON: Talmadge Coventry, MD ? ?ASSISTANT: Krista Blue, PA ?The advanced practice practitioner (APP) assisted throughout the case.  The APP was essential in retraction and counter traction when needed to make the case progress smoothly.  This retraction and assistance made it possible to see the tissue planes for the procedure.  The assistance was needed for hemostasis, tissue re-approximation and closure of the incision site.  ? ?ANESTHESIA:  General.  ? ?COMPLICATIONS: None.  ? ?INDICATIONS FOR PROCEDURE:  ?The patient, Carmen Gordon is a 61 y.o. female born on 09/09/1960, is here for treatment of bilateral breast reconstruction ?MRN: [id number] ? ?CONSENT:  ?Informed consent was obtained directly from the patient. Risks, benefits and alternatives were fully discussed. Specific risks including but not limited to bleeding, infection, hematoma, seroma, scarring, pain, contracture, asymmetry, wound healing problems, and need for further surgery were all discussed. The patient did have an ample opportunity to have questions answered to satisfaction.  ? ?DESCRIPTION OF PROCEDURE:  ?The patient was taken to the operating room. SCDs were placed and antibiotics were given.  General anesthesia was administered.  The patient's operative site was prepped and draped in a sterile fashion. A time out was performed and all information was confirmed to be correct.  Dr. Ninfa Linden performed bilateral nipple sparing mastectomies through an inframammary approach.  His part will be dictated separately.  Once he was finished I assumed care of the patient.  I started by examining the skin flaps which  clinically looked to be viable.  There is a small buttonhole medially on the right side.  Indocyanine green angiography was then performed showing overall good filling of the flaps.  There was a slightly delayed fill on the left side just superior to the incision and on the right side there were 2 isolated points of slow filling which corresponded to 2 areas where the flaps were little bit thinner.  These were very small and less than a centimeter in diameter.  I started on the left side.  I excised about a millimeters of skin along the superior border of the incision to excise the area of slower fill.  I then ensured meticulous hemostasis.  I then plicated laterally with a 0 V-Loc and placed a 15 French drain and secured with a nylon suture.  The Flex HD was then brought onto the field.  This was 2 pieces of Flex HD pliable pre which were soaked in saline and a 3-0 PDS was run around the periphery as a pursestring.  The expanders were then brought onto the field and I elected to use Mentor Arturo plus smooth ultrahigh high profile expanders with the prescribed fill volume of 535 cc.  On the left stay sutures were then placed at 3, 6, and 9:00.  The expanders were placed within the matrix and the pursestring tied down and suture tabs brought out at 3, 6, and 9:00.  Pocket was irrigated with triple antibiotic solution and the stay sutures were run through the suture tabs.  The expander was placed within the pocket and the stay sutures tied down.  200 cc was then injected into the expander which did not place any additional tension on the skin closure.  Skin was closed  with INSORB stapler and a running 3 OV lock. ? ?I then turned my attention to the right side.  The buttonhole was excised with a 15 blade.  This left viable edges remaining.  This was closed with interrupted buried 3-0 PDS sutures and a running 4-0 Monocryl subcuticular.  I then ensured hemostasis in the pocket.  There was a area centrally bordering the  nipple with some additional subcutaneous tissue relative to the rest of the flap and I was able to excise this with a knife and achieve hemostasis with cautery.  This was sent with the mastectomy specimen.  Additionally I excised about 8 mm of skin superior to the inframammary incision as I had done on the contralateral side.  I again ensured meticulous hemostasis.  The lateral soft tissues were plicated with a 0 V-Loc.  A 15 French drain was placed and secured with a nylon suture.  Stay sutures were placed at 3, 6, and 9:00.  The pocket was irrigated with triple antibiotic solution.  The stay sutures were run through the suture tabs of the second expander and the expander was placed into the pocket and the stay sutures tied down.  200 cc was infiltrated into the expander which placed no additional tension on the skin.  Skin closure was done with buried in sorb staples and a running 3 OV lock.  This gave a nice symmetric on table result.  In regards to the expanders the serial number for the left side is [id number].  The serial number on the right side is [id number].  Steri-Strips were applied all incisions along with a soft dressing. ? ?The patient tolerated the procedure well.  There were no complications. The patient was allowed to wake from anesthesia, extubated and taken to the recovery room in satisfactory condition.  ? ?

## 2021-10-16 ENCOUNTER — Encounter (HOSPITAL_BASED_OUTPATIENT_CLINIC_OR_DEPARTMENT_OTHER): Payer: Self-pay | Admitting: Surgery

## 2021-10-16 DIAGNOSIS — D0512 Intraductal carcinoma in situ of left breast: Secondary | ICD-10-CM | POA: Diagnosis not present

## 2021-10-16 MED ORDER — METHOCARBAMOL 500 MG PO TABS
500.0000 mg | ORAL_TABLET | Freq: Two times a day (BID) | ORAL | 0 refills | Status: AC | PRN
Start: 2021-10-16 — End: 2021-10-26

## 2021-10-16 NOTE — Discharge Summary (Signed)
Physician Discharge Summary  ?Patient ID: ?Carmen Gordon ?MRN: [id number] ?DOB/AGE: 11/16/1960 61 y.o. ? ?Admit date: 10/15/2021 ?Discharge date: 10/16/2021 ? ?Admission Diagnoses: ? ?Discharge Diagnoses:  ?Principal Problem: ?  S/P breast reconstruction ? ? ?Discharged Condition: Patient is day 1 postop from bilateral NSM with immediate reconstruction using tissue expanders and Flex HD. ? ?She had an episode of PONV last evening that responded well to antiemetics.  Patient then required oral oxycodone and Robaxin overnight which alleviated her pain.  Currently she describes 7 out of 10 pain.  Drains have been emptied, working well.  She has been able to ambulate to the bathroom.  Tolerating p.o. intake.  Husband at bedside.  Requesting prescription for muscle relaxants. ? ?Hospital Course: Admitted for observation subsequent to bilateral NSM with immediate reconstruction. ? ?Consults: None ? ?Significant Diagnostic Studies: None ? ?Treatments: surgery: Bilateral NSM with immediate reconstruction. ? ?Discharge Exam: ?Blood pressure (!) 123/59, pulse 87, temperature 98.5 ?F (36.9 ?C), resp. rate 18, height '5\' 6"'$  (1.676 m), weight 82.5 kg, SpO2 97 %. ?General appearance: alert, cooperative, and no acute distress ?Chest wall: NAC's appear viable bilaterally.  Incisions without evidence of dehiscence, Steri-Strips remain firmly intact.  No asymmetric swelling noted.  No obvious subcutaneous fluid collections.  JP drains intact and functional.  Normal-appearing drainage in bulbs bilaterally. ?Incision/Wound: Steri-Strips remain firmly intact. ?MSK: SCDs in place.  No asymmetric leg swelling. ? ?Disposition: Discharge disposition: 01-Home or Self Care ? ? ? ? ?Exam this morning is reassuring.  Educated on drains.  We will discharge patient home into care of husband.  Discussed pain control with NSAIDs and Tylenol.  Oxycodone for breakthrough pain.  Robaxin to help with muscle spasms and help her get comfortable at night.   She understands that the oxycodone and Robaxin are sedating and can make her prone to falls.  Aware of postoperative appointment scheduled with Dr. Claudia Desanctis next week. ? ? ?Discharge Instructions   ? ? Diet - low sodium heart healthy   Complete by: As directed ?  ? Increase activity slowly   Complete by: As directed ?  ? ?  ? ?Allergies as of 10/16/2021   ?No Known Allergies ?  ? ?  ?Medication List  ?  ? ?TAKE these medications   ? ?buPROPion 150 MG 12 hr tablet ?Commonly known as: WELLBUTRIN SR ?Take 150 mg by mouth 2 (two) times daily. ?  ?FLUoxetine 20 MG capsule ?Commonly known as: PROZAC ?Take by mouth every other day. ?  ?levothyroxine 75 MCG tablet ?Commonly known as: SYNTHROID ?Take 75 mcg by mouth daily. ?  ?methocarbamol 500 MG tablet ?Commonly known as: Robaxin ?Take 1 tablet (500 mg total) by mouth 2 (two) times daily as needed for up to 10 days for muscle spasms. ?  ?ondansetron 4 MG disintegrating tablet ?Commonly known as: ZOFRAN-ODT ?Take 1 tablet (4 mg total) by mouth every 8 (eight) hours as needed for nausea or vomiting. ?  ?sulfamethoxazole-trimethoprim 800-160 MG tablet ?Commonly known as: BACTRIM DS ?Take 1 tablet by mouth 2 (two) times daily for 14 days. ?  ? ?  ? ? Follow-up Information   ? ? Coralie Keens, MD Follow up in 3 week(s).   ?Specialty: General Surgery ?Contact information: ?[address] ?[address] ?[place] [id number] ?[phone] ? ? ?  ?  ? ?  ?  ? ?  ? ? ?[facility] ?[place], [place] [address] ?[phone] ? ?Signed: ?Krista Blue ?10/16/2021,  7:35 AM ? ? ?

## 2021-10-16 NOTE — Discharge Instructions (Addendum)
Activity: Avoid strenuous activity.  No heavy lifting. ? ?Diet: No restrictions.  Try to optimize nutrition with plenty of fruits and vegetables to improve healing. ? ?Wound Care: Leave breast binder on for 3 days.  You may then remove and shower normally.  Sponge bathing in interim.  Afterward you may transition to compressive bra.  Please keep Steri-Strips in place.  Empty JP drain bulbs as needed and record volumes.  You will need to bring the recorded daily output to your post-op appointment.   ? ?Please be careful when taking the robaxin with the oxycodone as they are both sedating and can make you prone to falls. ? ?Follow-Up: Scheduled for next week. ? ?Things to watch for:  Call the office if you experience fever, chills, persistent nausea, or significant bleeding.  Please also look out for malfunctioning drains or asymmetric swelling.  Mild wound drainage is common. ? ?

## 2021-10-17 LAB — SURGICAL PATHOLOGY

## 2021-10-18 ENCOUNTER — Telehealth: Payer: Self-pay

## 2021-10-18 NOTE — Telephone Encounter (Signed)
Patient called to say Krista Blue, PA-C, discharged her from the hospital on Tuesday of this week.  She said she had a double mastectomy and the information she received said she can take a shower.  She said that she has had her shower and put on her [employer].  She said it feels like the outside of her breasts are being shoved to the inside.  She said she knows it's supposed to be tight, but it feels different from the bindings they put on her in the hospital and she doesn't want to mess anything up.  Please call. ?

## 2021-10-18 NOTE — Telephone Encounter (Signed)
Per Donna Christen, pt can wear an ACE wrap if the bra is too tight at this point. Relayed the information to patient, she asked if the binder from surgery was ok, I told her that would be good if it is snug but not too tight. She agreed. ?

## 2021-10-22 ENCOUNTER — Encounter: Payer: Self-pay | Admitting: *Deleted

## 2021-10-24 ENCOUNTER — Ambulatory Visit (INDEPENDENT_AMBULATORY_CARE_PROVIDER_SITE_OTHER): Payer: BC Managed Care – PPO | Admitting: Plastic Surgery

## 2021-10-24 ENCOUNTER — Encounter: Payer: Self-pay | Admitting: Plastic Surgery

## 2021-10-24 ENCOUNTER — Other Ambulatory Visit: Payer: Self-pay

## 2021-10-24 DIAGNOSIS — C50912 Malignant neoplasm of unspecified site of left female breast: Secondary | ICD-10-CM

## 2021-10-24 MED ORDER — CELECOXIB 200 MG PO CAPS: 200.0000 mg | ORAL_CAPSULE | Freq: Two times a day (BID) | ORAL | 1 refills | Status: DC

## 2021-10-24 NOTE — Progress Notes (Signed)
Patient presents 1 week postop from bilateral mastectomy with tissue expander reconstruction.  She feels pain and discomfort laterally but otherwise seems to be coming along okay.  On exam her skin all looks good.  There is some mild bruising on both sides but nothing that looks like it is going to go on and develop a problem.  No subcutaneous fluid that I can tell.  Drain output is small and serosanguineous.  We will plan to leave the drains in for another week and then likely remove next week.  We will have her continue to wear compressive garments.  All of her questions were answered. ?

## 2021-10-31 ENCOUNTER — Ambulatory Visit (INDEPENDENT_AMBULATORY_CARE_PROVIDER_SITE_OTHER): Payer: BC Managed Care – PPO | Admitting: Plastic Surgery

## 2021-10-31 ENCOUNTER — Other Ambulatory Visit: Payer: Self-pay

## 2021-10-31 DIAGNOSIS — C50912 Malignant neoplasm of unspecified site of left female breast: Secondary | ICD-10-CM

## 2021-10-31 NOTE — Progress Notes (Signed)
Patient presents about 2 weeks postop from bilateral breast reconstruction with tissue expanders and acellular dermal matrix.  She feels well overall.  On exam her skin looks to be doing well.  There is still some faint bruising on the left side but it looks to be fading and no signs of impending skin necrosis.  Her incisions are intact and look good.  No obvious underlying fluid collections.  Drain output has been minimal so they were both removed today.  We discussed fill and she would prefer to hold off until her next visit.  We will plan to see her next week or the week after to start expansion.  All her questions were answered. ?

## 2021-11-05 NOTE — Progress Notes (Signed)
Patient is a 61 year old female with PMH of left breast DCIS s/p bilateral NSM with immediate reconstruction using tissue expanders and Flex HD performed 10/15/2021 by Dr. Claudia Desanctis who presents to clinic for postoperative follow-up. ? ?Patient was last seen here in clinic on 10/31/2021.  At that time, faint bruising on left side, but no evidence of skin necrosis.  Incisions remained intact.  JP drains removed without complication or difficulty.  Plan was to abstain from initial fill until subsequent encounter.  200 cc was infiltrated into each of the 535 cc expanders at time of surgery. ? ?Today, patient is doing well.  She describes mild tightness in her breasts bilaterally, but has not needed to take her Celebrex NSAID today.  She denies any redness, fevers, wound dehiscence, or other complications.  She does report that her Steri-Strips are getting yellow and smelly and would like for them to be removed today.  She states that her right breast is numb, but her left breast still has sensation. ? ?Physical exam is reassuring.  Good symmetry.  Left side appears to be mildly swollen, but no obvious subcutaneous fluid collection.  Bruising noted around left NAC.  No erythema.  Steri-Strips removed without complication or difficulty.  No wounds or dehiscence. ? ?She felt prepared for expander fill.  Skin is not too taut. ? ?We placed injectable saline in the Expander using a sterile technique: ?Right: 50 cc for a total of 250/535 cc ?Left: 50 cc for a total of 250/535 cc ? ?Return in 10 to 14 days for subsequent fill.  Call clinic should she develop any concerns in the interim. Picture(s) obtained of the patient and placed in the chart were with the patient's or guardian's permission. ? ? ?

## 2021-11-07 ENCOUNTER — Other Ambulatory Visit: Payer: Self-pay

## 2021-11-07 ENCOUNTER — Ambulatory Visit (INDEPENDENT_AMBULATORY_CARE_PROVIDER_SITE_OTHER): Payer: BC Managed Care – PPO | Admitting: Physician Assistant

## 2021-11-07 DIAGNOSIS — Z9889 Other specified postprocedural states: Secondary | ICD-10-CM

## 2021-11-08 ENCOUNTER — Encounter: Payer: Self-pay | Admitting: Hematology

## 2021-11-08 ENCOUNTER — Inpatient Hospital Stay: Payer: BC Managed Care – PPO | Attending: Hematology | Admitting: Hematology

## 2021-11-08 VITALS — BP 134/77 | HR 72 | Temp 98.3°F | Resp 18 | Ht 66.0 in | Wt 188.5 lb

## 2021-11-08 DIAGNOSIS — C50912 Malignant neoplasm of unspecified site of left female breast: Secondary | ICD-10-CM | POA: Diagnosis not present

## 2021-11-08 DIAGNOSIS — E039 Hypothyroidism, unspecified: Secondary | ICD-10-CM | POA: Diagnosis not present

## 2021-11-08 DIAGNOSIS — Z79899 Other long term (current) drug therapy: Secondary | ICD-10-CM | POA: Diagnosis not present

## 2021-11-08 DIAGNOSIS — D0512 Intraductal carcinoma in situ of left breast: Secondary | ICD-10-CM | POA: Diagnosis present

## 2021-11-08 DIAGNOSIS — Z170 Estrogen receptor positive status [ER+]: Secondary | ICD-10-CM | POA: Diagnosis not present

## 2021-11-08 DIAGNOSIS — Z9013 Acquired absence of bilateral breasts and nipples: Secondary | ICD-10-CM | POA: Diagnosis not present

## 2021-11-08 NOTE — Progress Notes (Signed)
?[place]   ?[phone] [phone]   ?Clinic Follow up Note  ? ?Patient Care Team: ? Nation, MD as PCP - General (Internal Medicine) ?Truitt Merle, MD as Consulting Physician (Hematology) ?Coralie Keens, MD as Consulting Physician (General Surgery) ?Renaldo Harrison, MD as Referring Physician (Radiation Oncology) ? ?Date of Service:  11/08/2021 ? ?CHIEF COMPLAINT: f/u of left breast DCIS ? ?CURRENT THERAPY:  ?Surveillance ? ?ASSESSMENT & PLAN:  ?Carmen Gordon is a 61 y.o. post-menopausal female with  ? ?1. Left breast DCIS, grade 2, ER+/PR+ ?-found on screening mammogram. Left diagnostic MM on 07/04/21 showed microcalcifications spanning 5.3 cm. Biopsy 07/20/21 confirmed DCIS, intermediate grade, to both areas. ?-breast MRI 08/15/21 showed: 7 cm area of non-mass enhancement involving entirety of LIQ; indeterminate 4 mm focus in central lateral right breast; indeterminate 5-6 mm left axillary lymph node; multiple subcentimeter liver masses.  ?-she opted to proceed with bilateral mastectomies with reconstruction on 10/15/21 with Dr. Ninfa Linden and Dr. Claudia Desanctis. Pathology showed at least 18 mm DCIS, small focus of cauterized DCIS at unoriented margin, otherwise negative margins.  ?-her DCIS has been cured by complete surgical resection.  ?-given she had bilateral mastectomies, there is no role for antiestrogen therapy or radiation therapy. She also does not require annual mammograms. ?-she will continue f/u with Dr. Claudia Desanctis for reconstruction, and I will see her back as needed. ?  ?2. Bone Health  ?-She has never had a DEXA. She notes she was scheduled for baseline in 10/2021 with her PCP. She will f/u with them. ?  ?3. Liver lesions ?-Incidental finding, was evaluated on liver MRI, which showed benign cysts or biliary hamartomas. ?  ?PLAN:  ?-f/u open ? ? ?No problem-specific Assessment & Plan notes found for this encounter. ? ? ?SUMMARY OF ONCOLOGIC HISTORY: ?Oncology History  Overview Note  ? Cancer Staging  ?Ductal carcinoma of left breast ([place]) ?Staging form: Breast, AJCC 8th Edition ?- Clinical stage from 07/20/2021: Stage 0 (cTis (DCIS), cN0, cM0, G2, ER+, PR+, HER2: Not Assessed) - Signed by Truitt Merle, MD on 08/16/2021 ? ?  ?Ductal carcinoma of left breast ([place])  ?07/04/2021 Mammogram  ? EXAM:  ?DIGITAL DIAGNOSTIC UNILATERAL LEFT MAMMOGRAM WITH TOMOSYNTHESIS AND CAD  ? ?Impression ? ?Indeterminate microcalcifications over the inner midportion of the  ?left breast spanning 5.3 cm.  ?  ?07/20/2021 Cancer Staging  ? Staging form: Breast, AJCC 8th Edition ?- Clinical stage from 07/20/2021: Stage 0 (cTis (DCIS), cN0, cM0, G2, ER+, PR+, HER2: Not Assessed) - Signed by Truitt Merle, MD on 08/16/2021 ?Stage prefix: Initial diagnosis ?Histologic grading system: 3 grade system ? ?  ?07/20/2021 Initial Biopsy  ? Diagnosis ?1. Breast, left, needle core biopsy, central medial, posterior, x clip ?- DUCTAL CARCINOMA IN SITU WITH CALCIFICATIONS ?- SEE COMMENT ?2. Breast, left, needle core biopsy, lower medial, anterior, coil clip ?- DUCTAL CARCINOMA IN SITU WITH CALCIFICATIONS ?- SEE COMMENT ? ?Microscopic Comment ?1. and 2. Based on the biopsy, the ductal carcinoma in situ has a cribriform pattern, intermediate nuclear grade and measures 0.3 cm in greatest linear extent. ? ?2. PROGNOSTIC INDICATORS ?Results: ?Estrogen Receptor: 100%, POSITIVE, STRONG STAINING INTENSITY ?Progesterone Receptor: 60%, POSITIVE, STRONG STAINING INTENSITY ?  ?08/09/2021 Initial Diagnosis  ? Ductal carcinoma of left breast [facility]) ?  ?08/15/2021 Imaging  ? EXAM: ?BILATERAL BREAST MRI WITH AND WITHOUT CONTRAST ? ?ADDENDUM: ?IMPRESSION: ?1. Subtle non-mass enhancement involving the entirety of the lower inner quadrant of the left breast corresponding with  the patient's biopsy-proven DCIS. This measures approximately 7 x 4 x 3 cm (AP by transverse by craniocaudal dimensions). ?2. Indeterminate 4 mm enhancing focus in the central  lateral right ?breast (series 9, image 60/144). Recommendation is for MRI guided biopsy. ?3. Indeterminate 5-6 mm low lying left axillary lymph node without ?clear fatty hilum. Recommendation is for second-look ultrasound for further characterization. ?4. Multiple subcentimeter T2 hyperintense masses throughout the ?liver not further characterized on today's study. Recommend ?correlation with prior cross-sectional imaging if available or ?further evaluation with contrast enhanced MRI. ?  ?10/15/2021 Definitive Surgery  ? FINAL MICROSCOPIC DIAGNOSIS:  ? ?A.   NIPPLE, LEFT BREAST, BIOPSY:  ?-    Negative for malignancy.  ?-    Benign nipple duct with surrounding fibrous stroma.  ? ?B.   BREAST, LEFT, MASTECTOMY:  ?-    Ductal carcinoma in situ (DCIS), grade II (intermediate grade),  ?micropapillary and  cribriform patterns, with central necrosis and microcalcifications.  ?-    Margins:  -Small focus (0.2 mm) of cauterized probable DCIS present at the resection inked margin of the smaller unoriented tissue portion (slide B16).  ?-    Prior biopsy site changes.  ?-    Microcalcifications associated with non-neoplastic tissue.  ? ?C.   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  ?-    Changes compatible with prior biopsy site scar.  ?-    No malignancy identified.  ?  ? ? ? ?INTERVAL HISTORY:  ?Carmen Gordon is here for a follow up of DCIS. She was last seen by me on 08/16/21 in consultation. She presents to the clinic alone. ?She reports she is doing well overall. She notes she has continued to recover well from breast surgery and is tolerating reconstruction (saline placement) well. ?  ?All other systems were reviewed with the patient and are negative. ? ?MEDICAL HISTORY:  ?Past Medical History:  ?Diagnosis Date  ? Anxiety 2010  ? Breast cancer ([place]) 07/20/2021  ? left breast DCIS  ? Complication of anesthesia   ? Depression 2010  ?  Hypothyroidism (acquired)   ? PONV (postoperative nausea and vomiting)   ? Thyroid disease 2010  ? ? ?SURGICAL HISTORY: ?Past Surgical History:  ?Procedure Laterality Date  ? ABDOMINAL HYSTERECTOMY  2001  ? BREAST RECONSTRUCTION WITH PLACEMENT OF TISSUE EXPANDER AND FLEX HD (ACELLULAR HYDRATED DERMIS) Bilateral 10/15/2021  ? Procedure: BREAST RECONSTRUCTION WITH PLACEMENT OF TISSUE EXPANDER AND FLEX HD (ACELLULAR HYDRATED DERMIS);  Surgeon: Cindra Presume, MD;  Location: [facility];  Service: Plastics;  Laterality: Bilateral;  ? foot surgery    ? NIPPLE SPARING MASTECTOMY Bilateral 10/15/2021  ? Procedure: BILATERAL NIPPLE SPARING MASTECTOMY;  Surgeon: Coralie Keens, MD;  Location: [facility];  Service: General;  Laterality: Bilateral;  ? ? ?I have reviewed the social history and family history with the patient and they are unchanged from previous note. ? ?ALLERGIES:  has No Known Allergies. ? ?MEDICATIONS:  ?Current Outpatient Medications  ?Medication Sig Dispense Refill  ? buPROPion (WELLBUTRIN SR) 150 MG 12 hr tablet Take 150 mg by mouth 2 (two) times daily.    ? celecoxib (CELEBREX) 200 MG capsule Take 1 capsule (200 mg total) by mouth 2 (two) times daily. 60 capsule 1  ? FLUoxetine (PROZAC) 20 MG capsule Take by mouth every other day.    ? levothyroxine (SYNTHROID) 75 MCG tablet Take  75 mcg by mouth daily.    ? ondansetron (ZOFRAN-ODT) 4 MG disintegrating tablet Take 1 tablet (4 mg total) by mouth every 8 (eight) hours as needed for nausea or vomiting. 20 tablet 0  ? ?No current facility-administered medications for this visit.  ? ? ?PHYSICAL EXAMINATION: ?ECOG PERFORMANCE STATUS: 0 - Asymptomatic ? ?Vitals:  ? 11/08/21 1059  ?BP: 134/77  ?Pulse: 72  ?Resp: 18  ?Temp: 98.3 ?F (36.8 ?C)  ?SpO2: 99%  ? ?Wt Readings from Last 3 Encounters:  ?11/08/21 188 lb 8 oz (85.5 kg)  ?10/15/21 181 lb 14.1 oz (82.5 kg)  ?10/03/21 183 lb 6.4 oz (83.2 kg)  ?  ? ?GENERAL:alert, no distress and  comfortable ?SKIN: skin color, texture, turgor are normal, no rashes or significant lesions ?EYES: normal, Conjunctiva are pink and non-injected, sclera clear  ?NEURO: alert & oriented x 3 with fluent speech, no foca

## 2021-11-09 ENCOUNTER — Telehealth: Payer: Self-pay | Admitting: Adult Health

## 2021-11-09 ENCOUNTER — Encounter: Payer: Self-pay | Admitting: *Deleted

## 2021-11-09 DIAGNOSIS — C50912 Malignant neoplasm of unspecified site of left female breast: Secondary | ICD-10-CM

## 2021-11-09 NOTE — Telephone Encounter (Signed)
Scheduled appt per 4/7 sch msg. Pt aware.  

## 2021-11-14 ENCOUNTER — Encounter: Payer: Self-pay | Admitting: Hematology

## 2021-11-15 ENCOUNTER — Inpatient Hospital Stay: Payer: BC Managed Care – PPO

## 2021-11-15 ENCOUNTER — Other Ambulatory Visit: Payer: Self-pay

## 2021-11-15 ENCOUNTER — Inpatient Hospital Stay (HOSPITAL_BASED_OUTPATIENT_CLINIC_OR_DEPARTMENT_OTHER): Payer: BC Managed Care – PPO | Admitting: Genetic Counselor

## 2021-11-15 ENCOUNTER — Other Ambulatory Visit: Payer: Self-pay | Admitting: Genetic Counselor

## 2021-11-15 DIAGNOSIS — Z1379 Encounter for other screening for genetic and chromosomal anomalies: Secondary | ICD-10-CM

## 2021-11-15 DIAGNOSIS — C50912 Malignant neoplasm of unspecified site of left female breast: Secondary | ICD-10-CM | POA: Diagnosis not present

## 2021-11-15 NOTE — Progress Notes (Signed)
REFERRING PROVIDER: ?Truitt Merle, MD ?[address] ?[place], [place] [address] ? ?PRIMARY PROVIDER:  ?[place] Nation, MD ? ?PRIMARY REASON FOR VISIT:  ?1. Ductal carcinoma of left breast ([facility])   ? ?HISTORY OF PRESENT ILLNESS:   ?Carmen Gordon, a 61 y.o. female, was seen for a [facility] cancer genetics consultation at the request of Dr. Burr Medico due to a personal history of cancer.  Carmen Gordon presents to clinic today to discuss the possibility of a hereditary predisposition to cancer, to discuss genetic testing, and to further clarify her future cancer risks, as well as potential cancer risks for family members.  ? ?In December 2022, at the age of 69, Carmen Gordon was diagnosed with ductal carcinoma in situ of the left breast. She had bilateral mastectomies with reconstruction in March 2023. ? ?CANCER HISTORY:  ?Oncology History Overview Note  ? Cancer Staging  ?Ductal carcinoma of left breast ([place]) ?Staging form: Breast, AJCC 8th Edition ?- Clinical stage from 07/20/2021: Stage 0 (cTis (DCIS), cN0, cM0, G2, ER+, PR+, HER2: Not Assessed) - Signed by Truitt Merle, MD on 08/16/2021 ? ?  ?Ductal carcinoma of left breast ([facility])  ?07/04/2021 Mammogram  ? EXAM:  ?DIGITAL DIAGNOSTIC UNILATERAL LEFT MAMMOGRAM WITH TOMOSYNTHESIS AND CAD  ? ?Impression ? ?Indeterminate microcalcifications over the inner midportion of the  ?left breast spanning 5.3 cm.  ?  ?07/20/2021 Cancer Staging  ? Staging form: Breast, AJCC 8th Edition ?- Clinical stage from 07/20/2021: Stage 0 (cTis (DCIS), cN0, cM0, G2, ER+, PR+, HER2: Not Assessed) - Signed by Truitt Merle, MD on 08/16/2021 ?Stage prefix: Initial diagnosis ?Histologic grading system: 3 grade system ? ?  ?07/20/2021 Initial Biopsy  ? Diagnosis ?1. Breast, left, needle core biopsy, central medial, posterior, x clip ?- DUCTAL CARCINOMA IN SITU WITH CALCIFICATIONS ?- SEE COMMENT ?2. Breast, left, needle core biopsy, lower medial, anterior, coil clip ?- DUCTAL CARCINOMA IN SITU WITH  CALCIFICATIONS ?- SEE COMMENT ? ?Microscopic Comment ?1. and 2. Based on the biopsy, the ductal carcinoma in situ has a cribriform pattern, intermediate nuclear grade and measures 0.3 cm in greatest linear extent. ? ?2. PROGNOSTIC INDICATORS ?Results: ?Estrogen Receptor: 100%, POSITIVE, STRONG STAINING INTENSITY ?Progesterone Receptor: 60%, POSITIVE, STRONG STAINING INTENSITY ?  ?08/09/2021 Initial Diagnosis  ? Ductal carcinoma of left breast Carmen Gordon) ?  ?08/15/2021 Imaging  ? EXAM: ?BILATERAL BREAST MRI WITH AND WITHOUT CONTRAST ? ?ADDENDUM: ?IMPRESSION: ?1. Subtle non-mass enhancement involving the entirety of the lower inner quadrant of the left breast corresponding with the patient's biopsy-proven DCIS. This measures approximately 7 x 4 x 3 cm (AP by transverse by craniocaudal dimensions). ?2. Indeterminate 4 mm enhancing focus in the central lateral right ?breast (series 9, image 60/144). Recommendation is for MRI guided biopsy. ?3. Indeterminate 5-6 mm low lying left axillary lymph node without ?clear fatty hilum. Recommendation is for second-look ultrasound for further characterization. ?4. Multiple subcentimeter T2 hyperintense masses throughout the ?liver not further characterized on today's study. Recommend ?correlation with prior cross-sectional imaging if available or ?further evaluation with contrast enhanced MRI. ?  ?10/15/2021 Definitive Surgery  ? FINAL MICROSCOPIC DIAGNOSIS:  ? ?A.   NIPPLE, LEFT BREAST, BIOPSY:  ?-    Negative for malignancy.  ?-    Benign nipple duct with surrounding fibrous stroma.  ? ?B.   BREAST, LEFT, MASTECTOMY:  ?-    Ductal carcinoma in situ (DCIS), grade II (intermediate grade),  ?micropapillary and  cribriform patterns, with central necrosis and microcalcifications.  ?-  Margins:  -Small focus (0.2 mm) of cauterized probable DCIS present at the resection inked margin of the smaller unoriented tissue portion (slide B16).  ?-    Prior biopsy site changes.  ?-     Microcalcifications associated with non-neoplastic tissue.  ? ?C.   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  ?-    Changes compatible with prior biopsy site scar.  ?-    No malignancy identified.  ?  ? ? ? ?RISK FACTORS:  ?First live birth at age 54.  ?OCP use for approximately 1 year.  ?Ovaries intact: yes.  ?Uterus intact: no.  ?Menopausal status: postmenopausal.  ?HRT use: 0 years. ?Colonoscopy: yes; normal. ?Mammogram within the last year: yes. ?Any excessive radiation exposure in the past: no ? ?Past Medical History:  ?Diagnosis Date  ? Anxiety 2010  ? Breast cancer ([facility]) 07/20/2021  ? left breast DCIS  ? Complication of anesthesia   ? Depression 2010  ? Hypothyroidism (acquired)   ? PONV (postoperative nausea and vomiting)   ? Thyroid disease 2010  ? ? ?Past Surgical History:  ?Procedure Laterality Date  ? ABDOMINAL HYSTERECTOMY  2001  ? BREAST RECONSTRUCTION WITH PLACEMENT OF TISSUE EXPANDER AND FLEX HD (ACELLULAR HYDRATED DERMIS) Bilateral 10/15/2021  ? Procedure: BREAST RECONSTRUCTION WITH PLACEMENT OF TISSUE EXPANDER AND FLEX HD (ACELLULAR HYDRATED DERMIS);  Surgeon: Cindra Presume, MD;  Location: [facility];  Service: Plastics;  Laterality: Bilateral;  ? foot surgery    ? NIPPLE SPARING MASTECTOMY Bilateral 10/15/2021  ? Procedure: BILATERAL NIPPLE SPARING MASTECTOMY;  Surgeon: Coralie Keens, MD;  Location: [facility];  Service: General;  Laterality: Bilateral;  ? ? ?Social History  ? ?Socioeconomic History  ? Marital status: Married  ?  Spouse name: Not on file  ? Number of children: 2  ? Years of education: Not on file  ? Highest education level: Not on file  ?Occupational History  ? Not on file  ?Tobacco Use  ? Smoking status: Never  ? Smokeless tobacco: Never  ? Tobacco comments:  ?  Father smoked  ?Substance and Sexual Activity  ? Alcohol use: Never  ? Drug use: Never  ? Sexual activity: Yes   ?  Birth control/protection: Surgical  ?Other Topics Concern  ? Not on file  ?Social History Narrative  ? Not on file  ? ?Social Determinants of Health  ? ?Financial Resource Strain: Not on file  ?Food Insecurity: Not on file  ?Transportation Needs: Not on file  ?Physical Activity: Not on file  ?Stress: Not on file  ?Social Connections: Not on file  ?  ? ?FAMILY HISTORY:  ?We obtained a detailed, 4-generation family history.  Significant diagnoses are listed below: ?Family History  ?Problem Relation Age of Onset  ? Melanoma Maternal Uncle   ? COPD Mother   ? Heart disease Father   ? ? ? ? ?Ms. Huestis's maternal uncle was diagnosed with melanoma around age 31 and died due to metastatic melanoma at age 55. Of note, Ms. Hout's mother had a premenopausal bilateral oophorectomy and Ms. Szczesny does not have any maternal aunts or maternal female cousins. ? ?Ms. Mcclatchey is unaware of previous family history of genetic testing for hereditary cancer risks. There is no reported Ashkenazi Jewish ancestry.  ? ?GENETIC COUNSELING ASSESSMENT: Ms. Hemann is a 61 y.o. female with a personal and family history of cancer which is  somewhat suggestive of a hereditary predisposition to cancer. We, therefore, discussed and recommended the following at today's visit.  ? ?DISCUSSION: We discussed that 5 - 10% of cancer is hereditary, with most cases of breast cancer associated with BRCA1/2.  There are other genes that can be associated with hereditary breast cancer syndromes.  We discussed that testing is beneficial for several reasons including knowing how to follow individuals after completing their treatment and understanding if other family members could be at an increased risk for cancer.  ? ?We reviewed the characteristics, features and inheritance patterns of hereditary cancer syndromes. We also discussed genetic testing, including the appropriate family members to test, the process of testing, insurance coverage and  turn-around-time for results. We discussed the implications of a negative, positive, carrier and/or variant of uncertain significant result. We recommended Ms. Birdwell pursue genetic testing for a panel that includes

## 2021-11-16 LAB — GENETIC SCREENING ORDER

## 2021-11-16 NOTE — Progress Notes (Signed)
Patient is a 61 year old female with PMH of left breast DCIS s/p bilateral NSM with immediate reconstruction using tissue expanders and Flex HD performed 10/15/2021 by Dr. Claudia Desanctis who presents to clinic for postoperative follow-up. ? ?Patient was last seen here in the office on 11/07/2021.  At that time, patient was doing well.  She described mild tightness in her breast bilaterally, but had not been requiring any NSAIDs or other analgesics.  Exam was reassuring, Steri-Strips removed.  No wounds or dehiscence.  Injectable saline was placed into each expander for a total of 250/535 cc each side. ? ?Today, patient is doing well.  She does not report any significant tightness or discomfort after last expander fill.  She feels prepared for subsequent fill.  She tells me that she was a C cup prior to surgery and would like to be approximately the same size.  She feels as though she is already starting to get close. ? ?Physical exam is entirely reassuring.  Good shape and symmetry.  Persistent old ecchymoses around left areola.  No subcutaneous fluid collections noted. ? ?We placed injectable saline in the Expander using a sterile technique: ?Right: 50 cc for a total of 300 / 535 cc ?Left: 50 cc for a total of 300 / 535 cc ? ?Return in 10 to 14 days for subsequent fill.  Suspect that it will likely be the last 1 given that she is already feeling close, particularly after today's fill.  After that point, will proceed to scheduling implant exchange.  Picture(s) obtained of the patient and placed in the chart were with the patient's or guardian's permission. ? ?

## 2021-11-21 ENCOUNTER — Ambulatory Visit (INDEPENDENT_AMBULATORY_CARE_PROVIDER_SITE_OTHER): Payer: BC Managed Care – PPO | Admitting: Physician Assistant

## 2021-11-21 DIAGNOSIS — Z9889 Other specified postprocedural states: Secondary | ICD-10-CM

## 2021-11-26 ENCOUNTER — Encounter: Payer: Self-pay | Admitting: Genetic Counselor

## 2021-11-26 ENCOUNTER — Telehealth: Payer: Self-pay | Admitting: Genetic Counselor

## 2021-11-26 DIAGNOSIS — Z1379 Encounter for other screening for genetic and chromosomal anomalies: Secondary | ICD-10-CM | POA: Insufficient documentation

## 2021-11-26 NOTE — Telephone Encounter (Signed)
I attempted to contact Ms. Livingstone to discuss her genetic testing results (77 genes). I left a voicemail requesting she call me back at [phone]. ? ?Lucille Passy, MS, LCGC ?Genetic Counselor ?[name] ?(P) [phone] ? ?

## 2021-11-27 ENCOUNTER — Telehealth: Payer: Self-pay | Admitting: Genetic Counselor

## 2021-11-27 NOTE — Telephone Encounter (Signed)
I contacted [name] to discuss her genetic testing results. No pathogenic variants were identified in the 77 genes analyzed. Detailed clinic note to follow. ? ?The test report has been scanned into EPIC and is located under the Molecular Pathology section of the Results Review tab.  A portion of the result report is included below for reference.  ? ?Carmen Passy, MS, [name] ?Genetic Counselor ?[name] ?(P) [phone] ? ? ?

## 2021-11-30 ENCOUNTER — Encounter: Payer: Self-pay | Admitting: Genetic Counselor

## 2021-11-30 NOTE — Progress Notes (Signed)
Patient is a 61 year old female with PMH of left breast DCIS s/p bilateral NSM with immediate reconstruction using tissue expanders and Flex HD performed 10/15/2021 by Dr. Claudia Desanctis who presents to clinic for postoperative follow-up. ? ?Patient was last seen here in clinic 11/21/2021.  At that time, exam was reassuring.  She felt prepared for expander fill.  She was a C cup prior to surgery and would like to be approximately the same size.  She tolerate expander fill for a total of 300/535 cc.  Plan was for return in 14 days for additional expander fill. ? ?Today, patient is doing well.  She endorses continued tightness in her breasts bilaterally, but no longer is requiring any NSAIDs or other analgesics.  She states that she is simply getting used to it.  She feels prepared for additional expander fill today.  She also feels as though she is satisfied with the size and is feeling ready for implant exchange. ? ?On physical exam, breasts with good shape and symmetry.  Skin is not too taut.  No subcutaneous fluid collections noted.  Persistent ecchymoses around left areola. ? ?We placed injectable saline in the Expander using a sterile technique: ?Right: 50 cc for a total of 350 / 535 cc ?Left: 50 cc for a total of 350 / 535 cc ? ?Will plan for second phase of reconstruction.  She may need additional expander fill at time of preoperative exam, will discuss with surgeon.  However, she seems pleased with current size.  Updated pictures. Picture(s) obtained of the patient and placed in the chart were with the patient's or guardian's permission. ? ?

## 2021-12-03 ENCOUNTER — Ambulatory Visit: Payer: BC Managed Care – PPO | Admitting: Physician Assistant

## 2021-12-03 DIAGNOSIS — Z9889 Other specified postprocedural states: Secondary | ICD-10-CM

## 2021-12-07 ENCOUNTER — Ambulatory Visit: Payer: Self-pay | Admitting: Genetic Counselor

## 2021-12-07 DIAGNOSIS — Z1379 Encounter for other screening for genetic and chromosomal anomalies: Secondary | ICD-10-CM

## 2021-12-07 NOTE — Progress Notes (Signed)
HPI:   ?Ms. Mizrachi was previously seen in the [place] [facility] clinic due to a personal and family history of cancer and concerns regarding a hereditary predisposition to cancer. Please refer to our prior cancer genetics clinic note for more information regarding our discussion, assessment and recommendations, at the time. Ms. Buske recent genetic test results were disclosed to her, as were recommendations warranted by these results. These results and recommendations are discussed in more detail below. ? ?CANCER HISTORY:  ?Oncology History Overview Note  ? 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 ?  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 EXAM:  ?DIGITAL DIAGNOSTIC UNILATERAL LEFT MAMMOGRAM WITH TOMOSYNTHESIS AND CAD  ? ?Impression ? ?Indeterminate microcalcifications over the inner midportion of the  ?left breast spanning 5.3 cm.  ?  ?07/20/2021 Cancer Staging  ? Staging form: Breast, AJCC 8th Edition ?- Clinical stage from 07/20/2021: Stage 0 (cTis (DCIS), cN0, cM0, G2, ER+, PR+, HER2: Not Assessed) - Signed by Truitt Merle, MD on 08/16/2021 ?Stage prefix: Initial diagnosis ?Histologic grading system: 3 grade system ? ?  ?07/20/2021 Initial Biopsy  ? Diagnosis ?1. Breast, left, needle core biopsy, central medial, posterior, x clip ?- DUCTAL CARCINOMA IN SITU WITH CALCIFICATIONS ?- SEE COMMENT ?2. Breast, left, needle core biopsy, lower medial, anterior, coil clip ?- DUCTAL CARCINOMA IN SITU WITH CALCIFICATIONS ?- SEE COMMENT ? ?Microscopic Comment ?1. and 2. Based on the biopsy, the ductal carcinoma in situ has a cribriform pattern, intermediate nuclear grade and measures 0.3 cm in greatest linear extent. ? ?2. PROGNOSTIC INDICATORS ?Results: ?Estrogen Receptor: 100%, POSITIVE,  STRONG STAINING INTENSITY ?Progesterone Receptor: 60%, POSITIVE, STRONG STAINING INTENSITY ?  ?08/09/2021 Initial Diagnosis  ? 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 ?  ?08/15/2021 Imaging  ? EXAM: ?BILATERAL BREAST MRI WITH AND WITHOUT CONTRAST ? ?ADDENDUM: ?IMPRESSION: ?1. Subtle non-mass enhancement involving the entirety of the lower inner quadrant of the left breast corresponding with the patient's biopsy-proven DCIS. This measures approximately 7 x 4 x 3 cm (AP by transverse by craniocaudal dimensions). ?2. Indeterminate 4 mm enhancing focus in the central lateral right ?breast (series 9, image 60/144). Recommendation is for MRI guided biopsy. ?3. Indeterminate 5-6 mm low lying left axillary lymph node without ?clear fatty hilum. Recommendation is for second-look ultrasound for further characterization. ?4. Multiple subcentimeter T2 hyperintense masses throughout the ?liver not further characterized on today's study. Recommend ?correlation with prior cross-sectional imaging if available or ?further evaluation with contrast enhanced MRI. ?  ?10/15/2021 Definitive Surgery  ? FINAL MICROSCOPIC DIAGNOSIS:  ? ?A.   NIPPLE, LEFT BREAST, BIOPSY:  ?-    Negative for malignancy.  ?-    Benign nipple duct with surrounding fibrous stroma.  ? ?B.   BREAST, LEFT, MASTECTOMY:  ?-    Ductal carcinoma in situ (DCIS), grade II (intermediate grade),  ?micropapillary and  cribriform patterns, with central necrosis and microcalcifications.  ?-    Margins:  -Small focus (0.2 mm) of cauterized probable DCIS present at the resection inked margin of the smaller unoriented tissue portion (slide B16).  ?-    Prior biopsy site changes.  ?-    Microcalcifications associated with non-neoplastic tissue.  ? ?C.   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  ?-    Changes compatible with prior biopsy site scar.  ?-    No malignancy identified.  ?  ? Genetic Testing  ?  [facility] CancerNext-Expanded Panel was Negative. Report date is 11/24/2021. ? ?The CancerNext-Expanded gene panel offered by [facility] and includes sequencing, rearrangement, and RNA analysis for the following 77 genes: AIP, ALK, APC, ATM, AXIN2, BAP1, BARD1, BLM, BMPR1A, BRCA1, BRCA2, BRIP1, CDC73, CDH1, CDK4, CDKN1B, CDKN2A, CHEK2, CTNNA1, DICER1, FANCC, FH, FLCN, GALNT12, KIF1B, LZTR1, MAX, MEN1, MET, MLH1, MSH2, MSH3, MSH6, MUTYH, NBN, NF1, NF2, NTHL1, PALB2, PHOX2B, PMS2, POT1, PRKAR1A, PTCH1, PTEN, RAD51C, RAD51D, RB1, RECQL, RET, SDHA, SDHAF2, SDHB, SDHC, SDHD, SMAD4, SMARCA4, SMARCB1, SMARCE1, STK11, SUFU, TMEM127, TP53, TSC1, TSC2, VHL and XRCC2 (sequencing and deletion/duplication); EGFR, EGLN1, HOXB13, KIT, MITF, PDGFRA, POLD1, and POLE (sequencing only); EPCAM and GREM1 (deletion/duplication only).  ?  ? ? ?FAMILY HISTORY:  ?We obtained a detailed, 4-generation family history.  Significant diagnoses are listed below: ?     ?Family History  ?Problem Relation Age of Onset  ? Melanoma Maternal Uncle    ? COPD Mother    ? Heart disease Father    ?  ?  ?  ?Ms. Lungren's maternal uncle was diagnosed with melanoma around age 57 and died due to metastatic melanoma at age 92. Of note, Ms. Brooker's mother had a premenopausal bilateral oophorectomy and Ms. Wilmore does not have any maternal aunts or maternal female cousins. ?  ?Ms. Bucks is unaware of previous family history of genetic testing for hereditary cancer risks. There is no reported Ashkenazi Jewish ancestry.  ? ?GENETIC TEST RESULTS:  ?The [facility] CancerNext-Expanded Panel found no pathogenic mutations.  ? ?The CancerNext-Expanded gene panel offered by [facility] and includes sequencing, rearrangement, and RNA analysis for the following 77 genes: AIP, ALK, APC, ATM, AXIN2, BAP1, BARD1, BLM, BMPR1A, BRCA1, BRCA2, BRIP1, CDC73, CDH1, CDK4, CDKN1B, CDKN2A, CHEK2, CTNNA1, DICER1, FANCC, FH, FLCN, GALNT12, KIF1B, LZTR1, MAX, MEN1, MET, MLH1,  MSH2, MSH3, MSH6, MUTYH, NBN, NF1, NF2, NTHL1, PALB2, PHOX2B, PMS2, POT1, PRKAR1A, PTCH1, PTEN, RAD51C, RAD51D, RB1, RECQL, RET, SDHA, SDHAF2, SDHB, SDHC, SDHD, SMAD4, SMARCA4, SMARCB1, SMARCE1, STK11, SUFU, TMEM127, TP53, TSC1, TSC2, VHL and XRCC2 (sequencing and deletion/duplication); EGFR, EGLN1, HOXB13, KIT, MITF, PDGFRA, POLD1, and POLE (sequencing only); EPCAM and GREM1 (deletion/duplication only).  ? ?The test report has been scanned into EPIC and is located under the Molecular Pathology section of the Results Review tab.  A portion of the result report is included below for reference. Genetic testing reported out on 11/24/2021.  ? ? ? ? ? ? ?Even though a pathogenic variant was not identified, possible explanations for her personal history of cancer may include: ?There may be no hereditary risk for cancer in the family. The cancers in Ms. Llamas and/or her family may be due to other genetic or environmental factors. ?There may be a gene mutation in one of these genes that current testing methods cannot detect, but that chance is small. ?There could be another gene that has not yet been discovered, or that we have not yet tested, that is responsible for the cancer diagnoses in the family.  ? ?Therefore, it is important to remain in touch with cancer genetics in the future so that we can continue to offer Ms. Fentress the most up to date genetic testing.  ? ?ADDITIONAL GENETIC TESTING:  ?We discussed with Ms. Skolnick that her genetic testing  was fairly extensive.  If there are genes identified to increase cancer risk that can be analyzed in the future, we would be happy to discuss and coordinate this testing at that time.   ? ?CANCER SCREENING RECOMMENDATIONS:  ?Ms. Peine's test result is considered negative (normal).  This means that we have not identified a hereditary cause for her personal and family history of cancer at this time. Most cancers happen by chance and this negative test suggests that  her cancer may fall into this category.   ? ?An individual's cancer risk and medical management are not determined by genetic test results alone. Overall cancer risk assessment incorporates additional factors,

## 2021-12-11 ENCOUNTER — Telehealth: Payer: Self-pay | Admitting: *Deleted

## 2021-12-11 ENCOUNTER — Encounter: Payer: BC Managed Care – PPO | Admitting: Genetic Counselor

## 2021-12-11 ENCOUNTER — Other Ambulatory Visit: Payer: BC Managed Care – PPO

## 2021-12-11 NOTE — Telephone Encounter (Signed)
Received on (11/07/21) via of fax DME Standard Written Order from [facility].  Requesting signature and return.  Given to provider to sign and return.   ? ?DME Standard Written Order signed and faxed back to [facility].  Confirmation received and copy scanned into the chart.//AB/CMA  ?

## 2021-12-17 ENCOUNTER — Telehealth: Payer: Self-pay | Admitting: Plastic Surgery

## 2021-12-17 NOTE — Telephone Encounter (Signed)
LVM to discuss surgery date 

## 2021-12-26 ENCOUNTER — Ambulatory Visit: Payer: BC Managed Care – PPO | Admitting: Plastic Surgery

## 2021-12-26 DIAGNOSIS — Z9889 Other specified postprocedural states: Secondary | ICD-10-CM

## 2021-12-26 NOTE — Progress Notes (Signed)
Patient presents for follow-up regarding her tissue expander reconstruction.  At her last visit which was about 3 weeks ago she had an additional 50 cc placed on each side giving her current volume of 350 cc in her 535 cc expanders.  She feels pretty happy with the current size.  On exam she looks to have a nice shape and a proportional size at the moment.  There is some darkening to the skin on the left side and I believe that is due to the mag trace that was placed at the time of her mastectomy.  Would anticipate this to fade out with time.  We will plan to be ready to do exchange for implants in the next few weeks.  All of her questions were answered today.

## 2021-12-29 ENCOUNTER — Other Ambulatory Visit: Payer: Self-pay | Admitting: Plastic Surgery

## 2022-01-01 NOTE — Progress Notes (Unsigned)
Patient ID: Carmen Gordon, female    DOB: 11/20/1960, 61 y.o.   MRN: [id number]  No chief complaint on file.   No diagnosis found.   History of Present Illness: Carmen Gordon is a 61 y.o.  female  with a history of bilateral breast reconstruction with placement of tissue expanders after bilateral nipple sparing mastectomies.  She presents for preoperative evaluation for upcoming procedure, removal of bilateral breast tissue expanders and placement of bilateral breast silicone implants, scheduled for 01/11/2022 with Dr. Claudia Desanctis.  The patient {HAS HAS [id number]} had problems with anesthesia. ***  Summary of Previous Visit: She currently has 350 out of 535 in her bilateral breast expanders  Job: Teacher,***  PMH Significant for: ***  Celebrex***   Past Medical History: Allergies: No Known Allergies  Current Medications:  Current Outpatient Medications:    buPROPion (WELLBUTRIN SR) 150 MG 12 hr tablet, Take 150 mg by mouth 2 (two) times daily., Disp: , Rfl:    celecoxib (CELEBREX) 200 MG capsule, Take 1 capsule (200 mg total) by mouth 2 (two) times daily., Disp: 60 capsule, Rfl: 1   FLUoxetine (PROZAC) 20 MG capsule, Take by mouth every other day., Disp: , Rfl:    levothyroxine (SYNTHROID) 75 MCG tablet, Take 75 mcg by mouth daily., Disp: , Rfl:   Past Medical Problems: Past Medical History:  Diagnosis Date   Anxiety 2010   Breast cancer ([place]) 07/20/2021   left breast DCIS   Complication of anesthesia    Depression 2010   Hypothyroidism (acquired)    PONV (postoperative nausea and vomiting)    Thyroid disease 2010    Past Surgical History: Past Surgical History:  Procedure Laterality Date   ABDOMINAL HYSTERECTOMY  2001   BREAST RECONSTRUCTION WITH PLACEMENT OF TISSUE EXPANDER AND FLEX HD (ACELLULAR HYDRATED DERMIS) Bilateral 10/15/2021   Procedure: BREAST RECONSTRUCTION WITH PLACEMENT OF TISSUE EXPANDER AND FLEX HD (ACELLULAR HYDRATED DERMIS);  Surgeon: Cindra Presume, MD;  Location: [facility];  Service: Plastics;  Laterality: Bilateral;   foot surgery     NIPPLE SPARING MASTECTOMY Bilateral 10/15/2021   Procedure: BILATERAL NIPPLE SPARING MASTECTOMY;  Surgeon: Coralie Keens, MD;  Location: [facility];  Service: General;  Laterality: Bilateral;    Social History: Social History   Socioeconomic History   Marital status: Married    Spouse name: Not on file   Number of children: 2   Years of education: Not on file   Highest education level: Not on file  Occupational History   Not on file  Tobacco Use   Smoking status: Never   Smokeless tobacco: Never   Tobacco comments:    Father smoked  Substance and Sexual Activity   Alcohol use: Never   Drug use: Never   Sexual activity: Yes    Birth control/protection: Surgical  Other Topics Concern   Not on file  Social History Narrative   Not on file   Social Determinants of Health   Financial Resource Strain: Not on file  Food Insecurity: Not on file  Transportation Needs: Not on file  Physical Activity: Not on file  Stress: Not on file  Social Connections: Not on file  Intimate Partner Violence: Not on file    Family History: Family History  Problem Relation Age of Onset   Melanoma Maternal Uncle    COPD Mother    Heart disease Father     Review of Systems: ROS  Physical Exam: Vital  Signs There were no vitals taken for this visit.  Physical Exam *** Constitutional:      General: Not in acute distress.    Appearance: Normal appearance. Not ill-appearing.  HENT:     Head: Normocephalic and atraumatic.  Eyes:     Pupils: Pupils are equal, round Neck:     Musculoskeletal: Normal range of motion.  Cardiovascular:     Rate and Rhythm: Normal rate    Pulses: Normal pulses.  Pulmonary:     Effort: Pulmonary effort is normal. No respiratory distress.  Abdominal:     General: Abdomen is flat. There is no distension.  Musculoskeletal:  Normal range of motion.  Skin:    General: Skin is warm and dry.     Findings: No erythema or rash.  Neurological:     General: No focal deficit present.     Mental Status: Alert and oriented to person, place, and time. Mental status is at baseline.     Motor: No weakness.  Psychiatric:        Mood and Affect: Mood normal.        Behavior: Behavior normal.    Assessment/Plan: The patient is scheduled for exchange of bilateral breast expanders for bilateral breast silicone implants with Dr. Claudia Desanctis.  Risks, benefits, and alternatives of procedure discussed, questions answered and consent obtained.    Smoking Status: ***; Counseling Given? *** Last Mammogram: Status post bilateral mastectomy; Results: N/A  Caprini Score: ***; Risk Factors include: ***, BMI *** 25, and length of planned surgery. Recommendation for mechanical *** prophylaxis. Encourage early ambulation.   Pictures were obtained at patient's appointment on  Post-op Rx sent to pharmacy: {Blank:[id number]::"Oxycodone, Zofran, Keflex","Oxycodone, Zofran"}  Patient was provided with the *** General Surgical Risk consent document and Pain Medication Agreement prior to their appointment.  They had adequate time to read through the risk consent documents and Pain Medication Agreement. We also discussed them in person together during this preop appointment. All of their questions were answered to their satisfaction.  Recommended calling if they have any further questions.  Risk consent form and Pain Medication Agreement to be scanned into patient's chart.  ***   Electronically signed by: Carola Rhine Bram Hottel, PA-C 01/01/2022 1:48 PM

## 2022-01-01 NOTE — H&P (View-Only) (Signed)
Patient ID: Carmen Gordon, female    DOB: 1960/12/03, 61 y.o.   MRN: [id number]  Chief Complaint  Patient presents with   Pre-op Exam      ICD-10-CM   1. S/P breast reconstruction  Z98.890     2. Ductal carcinoma of left breast ([facility])  C50.912       History of Present Illness: Carmen Gordon is a 61 y.o.  female  with a history of bilateral breast reconstruction with placement of tissue expanders after bilateral nipple sparing mastectomies.  She presents for preoperative evaluation for upcoming procedure, removal of bilateral breast tissue expanders and placement of bilateral breast silicone implants, scheduled for 01/11/2022 with Dr. Claudia Desanctis.  Presents today with her husband.  Patient reports nausea and vomiting after anesthesia, no other anesthetic complications. No history of DVT/PE.  No family history of DVT/PE.  No family or personal history of bleeding or clotting disorders.  Patient is not currently taking any blood thinners.  No history of CVA/MI.   Summary of Previous Visit: She currently has 350 out of 535 in her bilateral breast expanders  Job: Teacher, currently on summer break  She reports she is feeling well, with no recent changes to her health.  She is ready for surgery.  Denies any cardiac or pulmonary diseases.  She reports she read through the consent forms and has a few questions.   Past Medical History: Allergies: No Known Allergies  Current Medications:  Current Outpatient Medications:    buPROPion (WELLBUTRIN SR) 150 MG 12 hr tablet, Take 150 mg by mouth 2 (two) times daily., Disp: , Rfl:    FLUoxetine (PROZAC) 20 MG capsule, Take by mouth every other day., Disp: , Rfl:    levothyroxine (SYNTHROID) 75 MCG tablet, Take 75 mcg by mouth daily., Disp: , Rfl:    ondansetron (ZOFRAN) 4 MG tablet, Take 1 tablet (4 mg total) by mouth every 8 (eight) hours as needed for nausea or vomiting., Disp: 20 tablet, Rfl: 0   oxyCODONE (OXY IR/ROXICODONE) 5 MG immediate  release tablet, Take 1 tablet (5 mg total) by mouth every 6 (six) hours as needed for up to 2 days for severe pain., Disp: 8 tablet, Rfl: 0   celecoxib (CELEBREX) 200 MG capsule, Take 1 capsule (200 mg total) by mouth 2 (two) times daily. (Patient not taking: Reported on 01/02/2022), Disp: 60 capsule, Rfl: 1  Past Medical Problems: Past Medical History:  Diagnosis Date   Anxiety 2010   Breast cancer ([place]) 07/20/2021   left breast DCIS   Complication of anesthesia    Depression 2010   Hypothyroidism (acquired)    PONV (postoperative nausea and vomiting)    Thyroid disease 2010    Past Surgical History: Past Surgical History:  Procedure Laterality Date   ABDOMINAL HYSTERECTOMY  2001   BREAST RECONSTRUCTION WITH PLACEMENT OF TISSUE EXPANDER AND FLEX HD (ACELLULAR HYDRATED DERMIS) Bilateral 10/15/2021   Procedure: BREAST RECONSTRUCTION WITH PLACEMENT OF TISSUE EXPANDER AND FLEX HD (ACELLULAR HYDRATED DERMIS);  Surgeon: Cindra Presume, MD;  Location: [facility];  Service: Plastics;  Laterality: Bilateral;   foot surgery     NIPPLE SPARING MASTECTOMY Bilateral 10/15/2021   Procedure: BILATERAL NIPPLE SPARING MASTECTOMY;  Surgeon: Coralie Keens, MD;  Location: [facility];  Service: General;  Laterality: Bilateral;    Social History: Social History   Socioeconomic History   Marital status: Married    Spouse name: Not on file  Number of children: 2   Years of education: Not on file   Highest education level: Not on file  Occupational History   Not on file  Tobacco Use   Smoking status: Never   Smokeless tobacco: Never   Tobacco comments:    Father smoked  Substance and Sexual Activity   Alcohol use: Never   Drug use: Never   Sexual activity: Yes    Birth control/protection: Surgical  Other Topics Concern   Not on file  Social History Narrative   Not on file   Social Determinants of Health   Financial Resource Strain: Not on file  Food  Insecurity: Not on file  Transportation Needs: Not on file  Physical Activity: Not on file  Stress: Not on file  Social Connections: Not on file  Intimate Partner Violence: Not on file    Family History: Family History  Problem Relation Age of Onset   Melanoma Maternal Uncle    COPD Mother    Heart disease Father     Review of Systems: Review of Systems  Constitutional: Negative.   Respiratory: Negative.    Cardiovascular: Negative.   Neurological: Negative.    Physical Exam: Vital Signs BP 140/62 (BP Location: Left Arm, Patient Position: Sitting, Cuff Size: Large)   Pulse 68   Ht '5\' 6"'$  (1.676 m)   Wt 185 lb (83.9 kg)   SpO2 96%   BMI 29.86 kg/m   Physical Exam  Constitutional:      General: Not in acute distress.    Appearance: Normal appearance. Not ill-appearing.  HENT:     Head: Normocephalic and atraumatic.  Eyes:     Pupils: Pupils are equal, round Neck:     Musculoskeletal: Normal range of motion.  Cardiovascular:     Rate and Rhythm: Normal rate    Pulses: Normal pulses.  Pulmonary:     Effort: Pulmonary effort is normal. No respiratory distre Musculoskeletal: Normal range of motion.  Skin:    General: Skin is warm and dry.     Findings: No erythema or rash.  Neurological:     General: No focal deficit present.     Mental Status: Alert and oriented to person, place, and time. Mental status is at baseline.     Motor: No weakness.  Psychiatric:        Mood and Affect: Mood normal.        Behavior: Behavior normal.    Assessment/Plan: The patient is scheduled for exchange of bilateral breast expanders for bilateral breast silicone implants with Dr. Claudia Desanctis.  Risks, benefits, and alternatives of procedure discussed, questions answered and consent obtained.    Smoking Status: Non-smoker; Counseling Given?  N/A Last Mammogram: Status post bilateral mastectomy; Results: N/A  Caprini Score: 6, high; Risk Factors include: Age, BMI greater than 25,  history of cancer and length of planned surgery. Recommendation for mechanical prophylaxis. Encourage early ambulation.   Pictures were obtained at patient's appointment on  Post-op Rx sent to pharmacy: Oxycodone, Zofran  Patient was provided with the Tissue expander and General Surgical Risk consent document and Pain Medication Agreement prior to their appointment.  They had adequate time to read through the risk consent documents and Pain Medication Agreement. We also discussed them in person together during this preop appointment. All of their questions were answered to their satisfaction.  Recommended calling if they have any further questions.  Risk consent form and Pain Medication Agreement to be scanned into patient's chart.  The  risks that can be encountered with and after placement of a breast implant were discussed and include the following but not limited to these: bleeding, infection, delayed healing, anesthesia risks, skin sensation changes, injury to structures including nerves, blood vessels, and muscles which may be temporary or permanent, allergies to tape, suture materials and glues, blood products, topical preparations or injected agents, skin contour irregularities, skin discoloration and swelling, deep vein thrombosis, cardiac and pulmonary complications, pain, which may persist, fluid accumulation, wrinkling of the skin over the implanmt, changes in nipple or breast sensation, implant leakage or rupture, faulty position of the implant, persistent pain, formation of tight scar tissue around the implant (capsular contracture).  Patient was provided with the [facility] implant patient decision checklist and this was completed during today's preoperative evaluation. Patient had time to read through the information and any questions were answered to their content. Form will be scanned into patient's chart.  Recommend holding Celebrex 1 week prior to surgery.  Electronically signed by:  Carola Rhine Aiyanna Awtrey, PA-C 01/02/2022 9:22 AM

## 2022-01-02 ENCOUNTER — Encounter (HOSPITAL_BASED_OUTPATIENT_CLINIC_OR_DEPARTMENT_OTHER): Payer: Self-pay | Admitting: Plastic Surgery

## 2022-01-02 ENCOUNTER — Other Ambulatory Visit: Payer: Self-pay

## 2022-01-02 ENCOUNTER — Encounter: Payer: Self-pay | Admitting: Surgical

## 2022-01-02 ENCOUNTER — Ambulatory Visit (INDEPENDENT_AMBULATORY_CARE_PROVIDER_SITE_OTHER): Payer: BC Managed Care – PPO | Admitting: Surgical

## 2022-01-02 VITALS — BP 140/62 | HR 68 | Ht 66.0 in | Wt 185.0 lb

## 2022-01-02 DIAGNOSIS — C50912 Malignant neoplasm of unspecified site of left female breast: Secondary | ICD-10-CM

## 2022-01-02 DIAGNOSIS — Z9889 Other specified postprocedural states: Secondary | ICD-10-CM

## 2022-01-02 MED ORDER — OXYCODONE HCL 5 MG PO TABS: 5.0000 mg | ORAL_TABLET | Freq: Four times a day (QID) | ORAL | 0 refills | Status: AC | PRN

## 2022-01-02 MED ORDER — ONDANSETRON HCL 4 MG PO TABS: 4.0000 mg | ORAL_TABLET | Freq: Three times a day (TID) | ORAL | 0 refills | Status: DC | PRN

## 2022-01-08 NOTE — Progress Notes (Signed)
Patient was provided with CHG cleanser to use at home before the procedure. Patient verbalized understanding of instructions.

## 2022-01-11 ENCOUNTER — Encounter (HOSPITAL_BASED_OUTPATIENT_CLINIC_OR_DEPARTMENT_OTHER): Payer: Self-pay | Admitting: Plastic Surgery

## 2022-01-11 ENCOUNTER — Encounter (HOSPITAL_BASED_OUTPATIENT_CLINIC_OR_DEPARTMENT_OTHER)
Admission: RE | Disposition: A | Discharge status: Home / Self Care | Payer: Self-pay | Source: Home / Self Care | Attending: Plastic Surgery

## 2022-01-11 ENCOUNTER — Other Ambulatory Visit: Payer: Self-pay

## 2022-01-11 ENCOUNTER — Ambulatory Visit (HOSPITAL_BASED_OUTPATIENT_CLINIC_OR_DEPARTMENT_OTHER): Payer: BC Managed Care – PPO | Admitting: Certified Registered"

## 2022-01-11 ENCOUNTER — Ambulatory Visit (HOSPITAL_BASED_OUTPATIENT_CLINIC_OR_DEPARTMENT_OTHER)
Admission: RE | Admit: 2022-01-11 | Discharge: 2022-01-11 | Disposition: A | Discharge status: Home / Self Care | Payer: BC Managed Care – PPO | Attending: Plastic Surgery | Admitting: Plastic Surgery

## 2022-01-11 DIAGNOSIS — Z9889 Other specified postprocedural states: Secondary | ICD-10-CM | POA: Insufficient documentation

## 2022-01-11 DIAGNOSIS — Z421 Encounter for breast reconstruction following mastectomy: Secondary | ICD-10-CM | POA: Diagnosis not present

## 2022-01-11 DIAGNOSIS — Z01818 Encounter for other preprocedural examination: Secondary | ICD-10-CM

## 2022-01-11 DIAGNOSIS — C50912 Malignant neoplasm of unspecified site of left female breast: Secondary | ICD-10-CM | POA: Insufficient documentation

## 2022-01-11 HISTORY — PX: REMOVAL OF BILATERAL TISSUE EXPANDERS WITH PLACEMENT OF BILATERAL BREAST IMPLANTS: SHX6431

## 2022-01-11 SURGERY — REMOVAL, TISSUE EXPANDER, BREAST, BILATERAL, WITH BILATERAL IMPLANT IMPLANT INSERTION
Anesthesia: General | Site: Breast | Laterality: Bilateral

## 2022-01-11 MED ORDER — MIDAZOLAM HCL 5 MG/5ML IJ SOLN
INTRAMUSCULAR | Status: DC | PRN
  Administered 2022-01-11: 2 mg via INTRAVENOUS

## 2022-01-11 MED ORDER — DROPERIDOL 2.5 MG/ML IJ SOLN
INTRAMUSCULAR | Status: DC | PRN
  Administered 2022-01-11: .625 mg via INTRAVENOUS

## 2022-01-11 MED ORDER — ROCURONIUM BROMIDE 100 MG/10ML IV SOLN
INTRAVENOUS | Status: DC | PRN
  Administered 2022-01-11: 50 mg via INTRAVENOUS

## 2022-01-11 MED ORDER — LACTATED RINGERS IV SOLN: INTRAVENOUS | Status: DC

## 2022-01-11 MED ORDER — LIDOCAINE HCL (CARDIAC) PF 100 MG/5ML IV SOSY
PREFILLED_SYRINGE | INTRAVENOUS | Status: DC | PRN
  Administered 2022-01-11: 60 mg via INTRAVENOUS

## 2022-01-11 MED ORDER — HYDROMORPHONE HCL 1 MG/ML IJ SOLN
0.2500 mg | INTRAMUSCULAR | Status: DC | PRN
  Administered 2022-01-11: 0.5 mg via INTRAVENOUS

## 2022-01-11 MED ORDER — EPHEDRINE SULFATE (PRESSORS) 50 MG/ML IJ SOLN
INTRAMUSCULAR | Status: DC | PRN
  Administered 2022-01-11 (×2): 10 mg via INTRAVENOUS

## 2022-01-11 MED ORDER — FENTANYL CITRATE (PF) 100 MCG/2ML IJ SOLN
INTRAMUSCULAR | Status: AC
Start: 2022-01-11 — End: ?
  Filled 2022-01-11: qty 2

## 2022-01-11 MED ORDER — CEFAZOLIN SODIUM-DEXTROSE 2-4 GM/100ML-% IV SOLN
INTRAVENOUS | Status: AC
  Filled 2022-01-11: qty 100

## 2022-01-11 MED ORDER — ONDANSETRON HCL 4 MG/2ML IJ SOLN
INTRAMUSCULAR | Status: DC | PRN
  Administered 2022-01-11: 4 mg via INTRAVENOUS

## 2022-01-11 MED ORDER — PROPOFOL 10 MG/ML IV BOLUS
INTRAVENOUS | Status: AC
  Filled 2022-01-11: qty 20

## 2022-01-11 MED ORDER — SODIUM CHLORIDE 0.9 % IV SOLN
INTRAVENOUS | Status: AC
  Filled 2022-01-11: qty 10

## 2022-01-11 MED ORDER — PHENYLEPHRINE HCL (PRESSORS) 10 MG/ML IV SOLN
INTRAVENOUS | Status: DC | PRN
  Administered 2022-01-11: 80 ug via INTRAVENOUS
  Administered 2022-01-11 (×3): 40 ug via INTRAVENOUS
  Administered 2022-01-11: 80 ug via INTRAVENOUS
  Administered 2022-01-11 (×3): 40 ug via INTRAVENOUS
  Administered 2022-01-11: 80 ug via INTRAVENOUS
  Administered 2022-01-11: 40 ug via INTRAVENOUS

## 2022-01-11 MED ORDER — BUPIVACAINE-EPINEPHRINE (PF) 0.25% -1:200000 IJ SOLN
INTRAMUSCULAR | Status: AC
  Filled 2022-01-11: qty 30

## 2022-01-11 MED ORDER — LIDOCAINE 2% (20 MG/ML) 5 ML SYRINGE
INTRAMUSCULAR | Status: AC
  Filled 2022-01-11: qty 5

## 2022-01-11 MED ORDER — SODIUM CHLORIDE 0.9 % IV SOLN
INTRAVENOUS | Status: DC | PRN
  Administered 2022-01-11: 200 mL

## 2022-01-11 MED ORDER — DEXAMETHASONE SODIUM PHOSPHATE 10 MG/ML IJ SOLN
INTRAMUSCULAR | Status: AC
  Filled 2022-01-11: qty 1

## 2022-01-11 MED ORDER — DEXAMETHASONE SODIUM PHOSPHATE 4 MG/ML IJ SOLN
INTRAMUSCULAR | Status: DC | PRN
  Administered 2022-01-11: 10 mg via INTRAVENOUS

## 2022-01-11 MED ORDER — BUPIVACAINE-EPINEPHRINE 0.25% -1:200000 IJ SOLN
INTRAMUSCULAR | Status: DC | PRN
  Administered 2022-01-11: 20 mL

## 2022-01-11 MED ORDER — CEFAZOLIN SODIUM-DEXTROSE 2-4 GM/100ML-% IV SOLN
2.0000 g | INTRAVENOUS | Status: AC
  Administered 2022-01-11: 2 g via INTRAVENOUS

## 2022-01-11 MED ORDER — PROPOFOL 10 MG/ML IV BOLUS
INTRAVENOUS | Status: DC | PRN
  Administered 2022-01-11: 150 mg via INTRAVENOUS

## 2022-01-11 MED ORDER — CHLORHEXIDINE GLUCONATE CLOTH 2 % EX PADS: 6.0000 | MEDICATED_PAD | Freq: Once | CUTANEOUS | Status: DC

## 2022-01-11 MED ORDER — FENTANYL CITRATE (PF) 100 MCG/2ML IJ SOLN
INTRAMUSCULAR | Status: DC | PRN
Start: 2022-01-11 — End: 2022-01-11
  Administered 2022-01-11: 100 ug via INTRAVENOUS

## 2022-01-11 MED ORDER — SUGAMMADEX SODIUM 200 MG/2ML IV SOLN
INTRAVENOUS | Status: DC | PRN
  Administered 2022-01-11: 200 mg via INTRAVENOUS

## 2022-01-11 MED ORDER — MIDAZOLAM HCL 2 MG/2ML IJ SOLN
INTRAMUSCULAR | Status: AC
Start: 2022-01-11 — End: ?
  Filled 2022-01-11: qty 2

## 2022-01-11 MED ORDER — ONDANSETRON HCL 4 MG/2ML IJ SOLN
INTRAMUSCULAR | Status: AC
  Filled 2022-01-11: qty 2

## 2022-01-11 MED ORDER — HYDROMORPHONE HCL 1 MG/ML IJ SOLN
INTRAMUSCULAR | Status: AC
  Filled 2022-01-11: qty 0.5

## 2022-01-11 SURGICAL SUPPLY — 62 items
APL PRP STRL LF DISP 70% ISPRP (MISCELLANEOUS) ×1
BAG DECANTER FOR FLEXI CONT (MISCELLANEOUS) ×2 IMPLANT
BLADE SURG 10 STRL SS (BLADE) IMPLANT
BLADE SURG 15 STRL LF DISP TIS (BLADE) ×1 IMPLANT
BLADE SURG 15 STRL SS (BLADE) ×2
BNDG CMPR MED 10X6 ELC LF (GAUZE/BANDAGES/DRESSINGS) ×1
BNDG ELASTIC 6X10 VLCR STRL LF (GAUZE/BANDAGES/DRESSINGS) ×2 IMPLANT
CANISTER SUCT 1200ML W/VALVE (MISCELLANEOUS) ×2 IMPLANT
CHLORAPREP W/TINT 26 (MISCELLANEOUS) ×2 IMPLANT
COVER BACK TABLE 60X90IN (DRAPES) ×2 IMPLANT
COVER MAYO STAND STRL (DRAPES) ×2 IMPLANT
DRAIN CHANNEL 15F RND FF W/TCR (WOUND CARE) IMPLANT
DRAPE LAPAROSCOPIC ABDOMINAL (DRAPES) ×2 IMPLANT
DRAPE UTILITY XL STRL (DRAPES) ×2 IMPLANT
DRSG PAD ABDOMINAL 8X10 ST (GAUZE/BANDAGES/DRESSINGS) ×4 IMPLANT
ELECT BLADE 4.0 EZ CLEAN MEGAD (MISCELLANEOUS)
ELECT COATED BLADE 2.86 ST (ELECTRODE) IMPLANT
ELECT REM PT RETURN 9FT ADLT (ELECTROSURGICAL) ×2
ELECTRODE BLDE 4.0 EZ CLN MEGD (MISCELLANEOUS) IMPLANT
ELECTRODE REM PT RTRN 9FT ADLT (ELECTROSURGICAL) ×1 IMPLANT
EVACUATOR SILICONE 100CC (DRAIN) IMPLANT
FUNNEL KELLER 2 DISP (MISCELLANEOUS) ×2 IMPLANT
GAUZE SPONGE 4X4 12PLY STRL (GAUZE/BANDAGES/DRESSINGS) ×2 IMPLANT
GLOVE BIOGEL M STRL SZ7.5 (GLOVE) ×3 IMPLANT
GLOVE BIOGEL PI IND STRL 8 (GLOVE) ×1 IMPLANT
GLOVE BIOGEL PI INDICATOR 8 (GLOVE) ×1
GOWN STRL REUS W/ TWL LRG LVL3 (GOWN DISPOSABLE) ×2 IMPLANT
GOWN STRL REUS W/TWL LRG LVL3 (GOWN DISPOSABLE)
GOWN STRL REUS W/TWL XL LVL3 (GOWN DISPOSABLE) ×3 IMPLANT
IMPL BREAST P5.8XHI RND 490 (Breast) IMPLANT
IMPL BRST P5.8XHI RND 490CC (Breast) ×2 IMPLANT
IMPLANT BREAST GEL 490CC (Breast) ×4 IMPLANT
IV NS 500ML (IV SOLUTION)
IV NS 500ML BAXH (IV SOLUTION) IMPLANT
KIT FILL ASEPTIC TRANSFER (MISCELLANEOUS) IMPLANT
MARKER SKIN DUAL TIP RULER LAB (MISCELLANEOUS) IMPLANT
NDL HYPO 25X1 1.5 SAFETY (NEEDLE) ×1 IMPLANT
NEEDLE HYPO 25X1 1.5 SAFETY (NEEDLE) ×2 IMPLANT
PACK BASIN DAY SURGERY FS (CUSTOM PROCEDURE TRAY) ×2 IMPLANT
PENCIL SMOKE EVACUATOR (MISCELLANEOUS) ×2 IMPLANT
PIN SAFETY STERILE (MISCELLANEOUS) IMPLANT
SIZER BREAST REUSE 450CC (SIZER) ×2
SIZER BREAST REUSE 490CC (SIZER) ×4
SIZER BRST REUSE 450CC (SIZER) IMPLANT
SIZER BRST REUSE P5.8XHI 490CC (SIZER) IMPLANT
SLEEVE SCD COMPRESS KNEE MED (STOCKING) ×2 IMPLANT
SPIKE FLUID TRANSFER (MISCELLANEOUS) IMPLANT
SPONGE T-LAP 18X18 ~~LOC~~+RFID (SPONGE) ×2 IMPLANT
STAPLER INSORB 30 2030 C-SECTI (MISCELLANEOUS) ×1 IMPLANT
STAPLER VISISTAT 35W (STAPLE) ×2 IMPLANT
STRIP SUTURE WOUND CLOSURE 1/2 (MISCELLANEOUS) ×5 IMPLANT
SUT ETHILON 2 0 FS 18 (SUTURE) IMPLANT
SUT PDS 3-0 CT2 (SUTURE) ×4
SUT PDS II 3-0 CT2 27 ABS (SUTURE) ×2 IMPLANT
SUT VLOC 180 0 24IN GS25 (SUTURE) IMPLANT
SUT VLOC 90 P-14 23 (SUTURE) ×3 IMPLANT
SYR BULB IRRIG 60ML STRL (SYRINGE) ×2 IMPLANT
SYR CONTROL 10ML LL (SYRINGE) ×2 IMPLANT
TOWEL GREEN STERILE FF (TOWEL DISPOSABLE) ×2 IMPLANT
TUBE CONNECTING 20X1/4 (TUBING) ×2 IMPLANT
UNDERPAD 30X36 HEAVY ABSORB (UNDERPADS AND DIAPERS) ×4 IMPLANT
YANKAUER SUCT BULB TIP NO VENT (SUCTIONS) ×2 IMPLANT

## 2022-01-11 NOTE — Discharge Instructions (Addendum)
Activity As tolerated. NO showers for 3 days. Keep ACE wrap on breasts until then. After showering, put ACE wrap back on, this is important for compression. NO driving while in pain, taking pain medication or if you are unable to safely react to traffic. No heavy activities Take Pain medication (Oxycodone) as needed for severe pain. Otherwise, you can use ibuprofen or tylenol as needed. Avoid more than 3,000 mg of tylenol in 24 hours.  You do not need an antibiotic post-operatively unless this was discussed with the provider at your pre-op appointment.  Diet: Regular. Drink plenty of fluids and eat healthy (high protein, low carbs), Try to optimize your nutrition with plenty of fruits and vegetables to improve healing. Protein shakes are a good option.  Wound Care: Keep dressing clean & dry. You may change bandages after showering if you continue to notice some drainage. You can reuse bandages if they are not dirty/soiled. Mild wound drainage is common after breast reduction surgery and should not be cause for alarm.  Special Instructions: Call Doctor if any unusual problems occur such as pain, excessive Bleeding, unrelieved Nausea/vomiting, Fever &/or chills  Follow-up appointment: Previously scheduled.     Post Anesthesia Home Care Instructions  Activity: Get plenty of rest for the remainder of the day. A responsible individual must stay with you for 24 hours following the procedure.  For the next 24 hours, DO NOT: -Drive a car -Paediatric nurse -Drink alcoholic beverages -Take any medication unless instructed by your physician -Make any legal decisions or sign important papers.  Meals: Start with liquid foods such as gelatin or soup. Progress to regular foods as tolerated. Avoid greasy, spicy, heavy foods. If nausea and/or vomiting occur, drink only clear liquids until the nausea and/or vomiting subsides. Call your physician if vomiting continues.  Special  Instructions/Symptoms: Your throat may feel dry or sore from the anesthesia or the breathing tube placed in your throat during surgery. If this causes discomfort, gargle with warm salt water. The discomfort should disappear within 24 hours.  If you had a scopolamine patch placed behind your ear for the management of post- operative nausea and/or vomiting:  1. The medication in the patch is effective for 72 hours, after which it should be removed.  Wrap patch in a tissue and discard in the trash. Wash hands thoroughly with soap and water. 2. You may remove the patch earlier than 72 hours if you experience unpleasant side effects which may include dry mouth, dizziness or visual disturbances. 3. Avoid touching the patch. Wash your hands with soap and water after contact with the patch.

## 2022-01-11 NOTE — Op Note (Signed)
Operative Note   DATE OF OPERATION: 01/11/2022  SURGICAL DEPARTMENT: Plastic Surgery  PREOPERATIVE DIAGNOSES: Breast reconstruction status post tissue expander placement  POSTOPERATIVE DIAGNOSES:  same  PROCEDURE: Exchange bilateral breast tissue expanders for gel implants  SURGEON: Talmadge Coventry, MD  ASSISTANT: Verdie Shire, PA The advanced practice practitioner (APP) assisted throughout the case.  The APP was essential in retraction and counter traction when needed to make the case progress smoothly.  This retraction and assistance made it possible to see the tissue planes for the procedure.  The assistance was needed for hemostasis, tissue re-approximation and closure of the incision site.   ANESTHESIA:  General.   COMPLICATIONS: None.   INDICATIONS FOR PROCEDURE:  The patient, Carmen Gordon is a 61 y.o. female born on 1960-08-20, is here for treatment of breast reconstruction MRN: [id number]  CONSENT:  Informed consent was obtained directly from the patient. Risks, benefits and alternatives were fully discussed. Specific risks including but not limited to bleeding, infection, hematoma, seroma, scarring, pain, contracture, asymmetry, wound healing problems, and need for further surgery were all discussed. The patient did have an ample opportunity to have questions answered to satisfaction.   DESCRIPTION OF PROCEDURE:  The patient was taken to the operating room. SCDs were placed and antibiotics were given.  General anesthesia was administered.  The patient's operative site was prepped and draped in a sterile fashion. A time out was performed and all information was confirmed to be correct.  Started by marking out the planned incision which was around 7 cm along her inframammary fold scar.  Marcaine with epinephrine was then injected in the spot bilaterally.  Incision was made on both sides with a 15 blade and dissected down to the implant with cautery.  ADM was incised with  cautery.  The saline was removed from both expanders and both were removed without issue.  I inspected both pockets and ADM seem to be incorporated nicely.  We then brought sizers onto the field and settled on a volume of 490 cc.  We did stapled the skin closed and set the patient up to ensure appropriate shape size and symmetry.  Both pockets were then irrigated with triple antibiotic solution.  The implants were then brought onto the field.  I elected to use Mentor memory gel extra breast implants that were smoothed with a high profile.  Each had a volume of 490 cc.  Serial number for the right side [id number].  Serial number for the left side [id number].  I then changed my gloves.  Both pockets were inspected for hemostasis which was the case.  Both implants were placed with a Keller funnel atraumatically.  The ADM was closed on both sides with 3-0 PDS and skin was closed on both sides with buried INSORB staples and a running 3 OV lock.  Steri-Strips and a soft dressing were applied  The patient tolerated the procedure well.  There were no complications. The patient was allowed to wake from anesthesia, extubated and taken to the recovery room in satisfactory condition.

## 2022-01-11 NOTE — Transfer of Care (Signed)
Immediate Anesthesia Transfer of Care Note  Patient: Carmen Gordon  Procedure(s) Performed: REMOVAL OF BILATERAL TISSUE EXPANDERS WITH PLACEMENT OF BILATERAL BREAST IMPLANTS (Bilateral: Breast)  Patient Location: PACU  Anesthesia Type:General  Level of Consciousness: awake, alert  and oriented  Airway & Oxygen Therapy: Patient Spontanous Breathing and Patient connected to face mask oxygen  Post-op Assessment: Report given to RN and Post -op Vital signs reviewed and stable  Post vital signs: Reviewed and stable  Last Vitals:  Vitals Value Taken Time  BP 131/60 01/11/22 0850  Temp    Pulse 83 01/11/22 0850  Resp    SpO2 100 % 01/11/22 0850  Vitals shown include unvalidated device data.  Last Pain:  Vitals:   01/11/22 0627  TempSrc: Oral  PainSc: 0-No pain      Patients Stated Pain Goal: 7 (82/42/35 [id number])  Complications: No notable events documented.

## 2022-01-11 NOTE — Anesthesia Preprocedure Evaluation (Addendum)
Anesthesia Evaluation  Patient identified by MRN, date of birth, ID band Patient awake    Reviewed: Allergy & Precautions, NPO status , Patient's Chart, lab work & pertinent test results  History of Anesthesia Complications (+) PONV and history of anesthetic complications  Airway Mallampati: II  TM Distance: >3 FB     Dental   Pulmonary    breath sounds clear to auscultation       Cardiovascular negative cardio ROS   Rhythm:Regular Rate:Normal     Neuro/Psych    GI/Hepatic negative GI ROS, Neg liver ROS,   Endo/Other  negative endocrine ROSHypothyroidism   Renal/GU negative Renal ROS     Musculoskeletal   Abdominal   Peds  Hematology   Anesthesia Other Findings   Reproductive/Obstetrics                             Anesthesia Physical Anesthesia Plan  ASA: 2  Anesthesia Plan: General   Post-op Pain Management:    Induction: Intravenous  PONV Risk Score and Plan: 4 or greater and Ondansetron, Dexamethasone, Scopolamine patch - Pre-op and Midazolam  Airway Management Planned: Oral ETT  Additional Equipment:   Intra-op Plan:   Post-operative Plan:   Informed Consent: I have reviewed the patients History and Physical, chart, labs and discussed the procedure including the risks, benefits and alternatives for the proposed anesthesia with the patient or authorized representative who has indicated his/her understanding and acceptance.     Dental advisory given  Plan Discussed with: CRNA and Anesthesiologist  Anesthesia Plan Comments:         Anesthesia Quick Evaluation

## 2022-01-11 NOTE — Anesthesia Postprocedure Evaluation (Signed)
Anesthesia Post Note  Patient: Carmen Gordon  Procedure(s) Performed: REMOVAL OF BILATERAL TISSUE EXPANDERS WITH PLACEMENT OF BILATERAL BREAST IMPLANTS (Bilateral: Breast)     Patient location during evaluation: PACU Anesthesia Type: General Level of consciousness: awake Pain management: pain level controlled Vital Signs Assessment: post-procedure vital signs reviewed and stable Postop Assessment: no apparent nausea or vomiting Anesthetic complications: no   No notable events documented.  Last Vitals:  Vitals:   01/11/22 0900 01/11/22 0915  BP: 117/62 118/60  Pulse: 82 71  Resp: 17 16  Temp:    SpO2: 100% 94%    Last Pain:  Vitals:   01/11/22 0907  TempSrc:   PainSc: 4                  Devlynn Knoff

## 2022-01-11 NOTE — Anesthesia Procedure Notes (Signed)
Procedure Name: Intubation Date/Time: 01/11/2022 7:54 AM  Performed by: Verita Lamb, CRNAPre-anesthesia Checklist: Patient identified, Emergency Drugs available, Suction available and Patient being monitored Patient Re-evaluated:Patient Re-evaluated prior to induction Oxygen Delivery Method: Circle system utilized Preoxygenation: Pre-oxygenation with 100% oxygen Induction Type: IV induction Ventilation: Mask ventilation without difficulty Laryngoscope Size: Mac and 4 Grade View: Grade I Tube type: Oral Tube size: 7.0 mm Number of attempts: 1 Airway Equipment and Method: Stylet and Oral airway Placement Confirmation: ETT inserted through vocal cords under direct vision, positive ETCO2, breath sounds checked- equal and bilateral and CO2 detector Secured at: 22 cm Tube secured with: Tape Dental Injury: Teeth and Oropharynx as per pre-operative assessment

## 2022-01-11 NOTE — Interval H&P Note (Signed)
Patient seen and examined risks and benefits discussed. Proceed with surgery.

## 2022-01-14 ENCOUNTER — Encounter (HOSPITAL_BASED_OUTPATIENT_CLINIC_OR_DEPARTMENT_OTHER): Payer: Self-pay | Admitting: Plastic Surgery

## 2022-01-17 ENCOUNTER — Ambulatory Visit (INDEPENDENT_AMBULATORY_CARE_PROVIDER_SITE_OTHER): Payer: BC Managed Care – PPO | Admitting: Plastic Surgery

## 2022-01-17 DIAGNOSIS — Z9889 Other specified postprocedural states: Secondary | ICD-10-CM

## 2022-01-17 NOTE — Progress Notes (Signed)
Patient presents postop from exchange of tissue expander for gel implants.  She is overall very happy.  On exam her incisions are healing fine and she has a great result.  Of asked her to continue her supportive bra and we will see her again in a few weeks.  All of her questions were answered.

## 2022-01-23 ENCOUNTER — Telehealth: Payer: Self-pay | Admitting: *Deleted

## 2022-01-23 NOTE — Telephone Encounter (Signed)
Received on (10/11/21) via of fax DME  Standard Written Order from [facility].  Requesting review,signature, and return.  Given to provider to review,sign, and return.     DME Standard Written Order reviewed,signed, and returned to [facility].  Confirmation received and copy scanned into the chart.//AB/CMA

## 2022-02-04 ENCOUNTER — Ambulatory Visit (INDEPENDENT_AMBULATORY_CARE_PROVIDER_SITE_OTHER): Payer: Self-pay | Admitting: Plastic Surgery

## 2022-02-04 DIAGNOSIS — Z411 Encounter for cosmetic surgery: Secondary | ICD-10-CM

## 2022-02-06 NOTE — Progress Notes (Signed)
   Referring Provider [place], [place],  [place] [address]   CC:  Consult for Botox or Dysport.  Carmen Gordon is an 61 y.o. female.  HPI: Patient is a 61 year old who is interested in nonsurgical options for facial rejuvenation.  She is interested in Botox or Dysport.  Review of Systems General: No fever or chills.  Physical Exam    01/11/2022    9:22 AM 01/11/2022    9:15 AM 01/11/2022    9:00 AM  Vitals with BMI  Systolic [phone]  Diastolic 51 60 62  Pulse 73 71 82    General:  No acute distress,  Alert and oriented, Non-Toxic, Normal speech and affect HEENT: Patient has some typical forehead lines, 11's and crows feet  Assessment/Plan We discussed options for Botox and filler or Dysport.  She will likely schedule in the future but does not want to have injection today due to a family event.  Lennice Sites 02/06/2022, 7:53 PM

## 2022-02-07 ENCOUNTER — Ambulatory Visit (INDEPENDENT_AMBULATORY_CARE_PROVIDER_SITE_OTHER): Payer: BC Managed Care – PPO | Admitting: Surgical

## 2022-02-07 DIAGNOSIS — C50912 Malignant neoplasm of unspecified site of left female breast: Secondary | ICD-10-CM

## 2022-02-07 DIAGNOSIS — Z9889 Other specified postprocedural states: Secondary | ICD-10-CM

## 2022-02-07 NOTE — Progress Notes (Signed)
Patient is a 61 year old female here for follow-up after exchange of bilateral breast tissue expanders to bilateral silicone breast implants with Dr. Claudia Desanctis on 01/11/2022.  She had 490 cc Mentor memory gel high-profile extra implants placed.  She reports she is doing really well, reports the implants are much softer.  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  She is otherwise doing well.  She is not having any infectious symptoms.  She has some questions about ongoing restrictions and scar creams.  Chaperone present on exam On exam bilateral NAC's are viable, bilateral inframammary fold incisions are healing well.  No subcutaneous fluid collection noted palpation.  No erythema or cellulitic changes.  Bilateral breast capsules are soft, bilateral breasts are symmetric, good shape and symmetry.  Assessment/plan:  -Patient can begin using scar cream daily starting today.  We discussed various options. -Patient can continue wearing compressive garments 24/7 for 2 more weeks, can switch to compressive garments just throughout the day until 2 to 3 months postop and then can switch to normal bras without underwire at that point. -Discussed follow-up in 6 months for reevaluation.  Discussed calling with questions or concerns if she notices any changes and needs to be seen sooner.  We discussed recommendations for imaging in the future.  All the patient's questions were answered to her content.  Pictures were taken and placed in patient's chart with patient's permission.  Recommend calling with questions or concerns.

## 2022-02-11 ENCOUNTER — Inpatient Hospital Stay: Payer: BC Managed Care – PPO | Admitting: Adult Health

## 2022-02-15 ENCOUNTER — Ambulatory Visit (INDEPENDENT_AMBULATORY_CARE_PROVIDER_SITE_OTHER): Payer: Self-pay | Admitting: Plastic Surgery

## 2022-02-15 DIAGNOSIS — Z411 Encounter for cosmetic surgery: Secondary | ICD-10-CM

## 2022-02-15 NOTE — Progress Notes (Signed)
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 I discussed with the patient this proposed procedure of botulinum toxin injections, which is customized depending on the particular needs of the patient. It is performed on facial rhytids as a temporary correction. The alternatives were discussed with the patient. The risks were addressed including bleeding, scarring, infection, damage to deeper structures, asymmetry, and chronic pain, which may occur infrequently after a procedure. The individual's choice to undergo a surgical procedure is based on the comparison of risks to potential benefits. Other risks include unsatisfactory results, brow ptosis, eyelid ptosis, allergic reaction, temporary paralysis, which should go away with time, bruising, blurring disturbances and delayed healing. Botulinum toxin injections do not arrest the aging process or produce permanent tightening of the eyelid.  Operative intervention maybe necessary to maintain the results of a blepharoplasty or botulinum toxin. The patient understands and wishes to proceed.  Procedure: The area was prepped with alcohol and dried with a clean gauze. Using a clean technique, the botulinum toxin was diluted with 2.5 cc of preservative-free normal saline which was slowly injected with an 18 gauge needle in a tuberculin syringes.  A 32 gauge needles were then used to inject the botulinum toxin. This mixture allow for an aliquot of 4 units per 0.1 cc in each injection site.    Subsequently the mixture was injected into the following regions: 15 U of botox was injected into the glabella.   Botox LOT:  [id number] EXP:  2025/05

## 2022-06-14 ENCOUNTER — Encounter: Payer: BC Managed Care – PPO | Admitting: Plastic Surgery

## 2022-06-17 ENCOUNTER — Encounter: Payer: BC Managed Care – PPO | Admitting: Plastic Surgery

## 2022-06-18 ENCOUNTER — Encounter: Payer: BC Managed Care – PPO | Admitting: Surgical

## 2022-06-18 ENCOUNTER — Encounter: Payer: Self-pay | Admitting: Hematology

## 2022-06-25 ENCOUNTER — Ambulatory Visit (INDEPENDENT_AMBULATORY_CARE_PROVIDER_SITE_OTHER): Payer: Self-pay | Admitting: Surgical

## 2022-06-25 ENCOUNTER — Encounter: Payer: BC Managed Care – PPO | Admitting: Physician Assistant

## 2022-06-25 ENCOUNTER — Encounter: Payer: Self-pay | Admitting: Surgical

## 2022-06-25 VITALS — BP 134/81 | HR 76

## 2022-06-25 DIAGNOSIS — Z411 Encounter for cosmetic surgery: Secondary | ICD-10-CM

## 2022-06-25 NOTE — Progress Notes (Signed)
Botulinum Toxin Procedure Note  Procedure: Cosmetic botulinum toxin  Pre-operative Diagnosis: Dynamic rhytides  Post-operative Diagnosis: Same  Complications:  None  Brief history: The patient desires botulinum toxin injection.  She is aware of the risks including bleeding, damage to deeper structures, asymmetry, brow ptosis, eyelid ptosis, bruising. The patient understands and wishes to proceed.  Procedure: The area was prepped with alcohol and dried with a clean gauze.  Using a clean technique the botulinum toxin was diluted with 2.5 mL of bacteriostatic saline per 100 unit vial which resulted in 4 units per 0.1 mL.  Subsequently the mixture was injected in the glabellar, lateral canthal lines (1 injection site each), forehead area with preservation of the temporal branch to the lateral eyebrow. A total of 24 Units of botulinum toxin was used. The forehead and glabellar area was injected with care to inject intramuscular only while holding pressure on the supratrochlear vessels in each area during each injection on either side of the medial corrugators. The injection proceeded vertically superiorly to the medial 2/3 of the frontalis muscle and superior 2/3 of the lateral frontalis, again with preservation of the frontal branch.  No complications were noted. Light pressure was held for 5 minutes. She was instructed explicitly in post-operative care.  Botox LOT:  [id number] EXP:  09/2024

## 2022-06-26 ENCOUNTER — Ambulatory Visit: Payer: BC Managed Care – PPO | Admitting: Nurse Practitioner

## 2022-07-02 ENCOUNTER — Inpatient Hospital Stay: Payer: BC Managed Care – PPO | Attending: Hematology | Admitting: Hematology

## 2022-07-02 VITALS — BP 131/57 | HR 79 | Temp 97.6°F | Resp 18 | Ht 66.0 in | Wt 187.0 lb

## 2022-07-02 DIAGNOSIS — K769 Liver disease, unspecified: Secondary | ICD-10-CM | POA: Diagnosis not present

## 2022-07-02 DIAGNOSIS — Z79899 Other long term (current) drug therapy: Secondary | ICD-10-CM | POA: Diagnosis not present

## 2022-07-02 DIAGNOSIS — C50912 Malignant neoplasm of unspecified site of left female breast: Secondary | ICD-10-CM | POA: Diagnosis not present

## 2022-07-02 DIAGNOSIS — E039 Hypothyroidism, unspecified: Secondary | ICD-10-CM | POA: Diagnosis not present

## 2022-07-02 DIAGNOSIS — Z79890 Hormone replacement therapy (postmenopausal): Secondary | ICD-10-CM | POA: Insufficient documentation

## 2022-07-02 DIAGNOSIS — D0512 Intraductal carcinoma in situ of left breast: Secondary | ICD-10-CM | POA: Insufficient documentation

## 2022-07-02 DIAGNOSIS — Z9013 Acquired absence of bilateral breasts and nipples: Secondary | ICD-10-CM | POA: Diagnosis not present

## 2022-07-02 DIAGNOSIS — Z170 Estrogen receptor positive status [ER+]: Secondary | ICD-10-CM | POA: Insufficient documentation

## 2022-07-02 NOTE — Progress Notes (Unsigned)
[place]   [phone] [phone]   Clinic Follow up Note   Patient Care Team: [place] Nation, MD as PCP - General (Internal Medicine) Truitt Merle, MD as Consulting Physician (Hematology) Coralie Keens, MD as Consulting Physician (General Surgery) Renaldo Harrison, MD as Referring Physician (Radiation Oncology)  Date of Service:  07/02/2022  CHIEF COMPLAINT: f/u of  left breast DCIS   CURRENT THERAPY:   Surveillance   ASSESSMENT:  Carmen Gordon is a 61 y.o. female with    1. Left breast DCIS, grade 2, ER+/PR+ -found on screening mammogram. Left diagnostic MM on 07/04/21 showed microcalcifications spanning 5.3 cm. Biopsy 07/20/21 confirmed DCIS, intermediate grade, to both areas. -breast MRI 08/15/21 showed: 7 cm area of non-mass enhancement involving entirety of LIQ; indeterminate 4 mm focus in central lateral right breast; indeterminate 5-6 mm left axillary lymph node; multiple subcentimeter liver masses.  -she opted to proceed with bilateral mastectomies with reconstruction on 10/15/21 with Dr. Ninfa Linden and Dr. Claudia Desanctis. Pathology showed at least 18 mm DCIS, small focus of cauterized DCIS at unoriented margin, otherwise negative margins.  -her DCIS has been cured by complete surgical resection.  -given she had bilateral mastectomies, there is no role for antiestrogen therapy or radiation therapy. She also does not require annual mammograms. -she will continue f/u with Dr. Claudia Desanctis for reconstruction, and I will see her back as needed.   2. Bone Health  -She has never had a DEXA. She notes she was scheduled for baseline in 10/2021 with her PCP. She will f/u with them.   3. Liver lesions -Incidental finding, was evaluated on liver MRI, which showed benign cysts or biliary hamartomas.  No problem-specific Assessment & Plan notes found for this encounter.     PLAN: - Dr.Blackman.Dr.Dellinghand for a f/u MRI -f/u if need be - SUMMARY OF  ONCOLOGIC HISTORY: Oncology History Overview Note   Cancer Staging  Ductal carcinoma of left breast ([place]) Staging form: Breast, AJCC 8th Edition - Clinical stage from 07/20/2021: Stage 0 (cTis (DCIS), cN0, cM0, G2, ER+, PR+, HER2: Not Assessed) - Signed by Truitt Merle, MD on 08/16/2021    Ductal carcinoma of left breast ([place])  07/04/2021 Mammogram   EXAM:  DIGITAL DIAGNOSTIC UNILATERAL LEFT MAMMOGRAM WITH TOMOSYNTHESIS AND CAD   Impression  Indeterminate microcalcifications over the inner midportion of the  left breast spanning 5.3 cm.    07/20/2021 Cancer Staging   Staging form: Breast, AJCC 8th Edition - Clinical stage from 07/20/2021: Stage 0 (cTis (DCIS), cN0, cM0, G2, ER+, PR+, HER2: Not Assessed) - Signed by Truitt Merle, MD on 08/16/2021 Stage prefix: Initial diagnosis Histologic grading system: 3 grade system   07/20/2021 Initial Biopsy   Diagnosis 1. Breast, left, needle core biopsy, central medial, posterior, x clip - DUCTAL CARCINOMA IN SITU WITH CALCIFICATIONS - SEE COMMENT 2. Breast, left, needle core biopsy, lower medial, anterior, coil clip - DUCTAL CARCINOMA IN SITU WITH CALCIFICATIONS - SEE COMMENT  Microscopic Comment 1. and 2. Based on the biopsy, the ductal carcinoma in situ has a cribriform pattern, intermediate nuclear grade and measures 0.3 cm in greatest linear extent.  2. PROGNOSTIC INDICATORS Results: Estrogen Receptor: 100%, POSITIVE, STRONG STAINING INTENSITY Progesterone Receptor: 60%, POSITIVE, STRONG STAINING INTENSITY   08/09/2021 Initial Diagnosis   Ductal carcinoma of left breast ([place])   08/15/2021 Imaging   EXAM: BILATERAL BREAST MRI WITH AND WITHOUT CONTRAST  ADDENDUM: IMPRESSION: 1. Subtle non-mass enhancement involving the entirety of the lower  inner quadrant of the left breast corresponding with the patient's biopsy-proven DCIS. This measures approximately 7 x 4 x 3 cm (AP by transverse by craniocaudal dimensions). 2. Indeterminate 4 mm  enhancing focus in the central lateral right breast (series 9, image 60/144). Recommendation is for MRI guided biopsy. 3. Indeterminate 5-6 mm low lying left axillary lymph node without clear fatty hilum. Recommendation is for second-look ultrasound for further characterization. 4. Multiple subcentimeter T2 hyperintense masses throughout the liver not further characterized on today's study. Recommend correlation with prior cross-sectional imaging if available or further evaluation with contrast enhanced MRI.   10/15/2021 Definitive Surgery   FINAL MICROSCOPIC DIAGNOSIS:   A.   NIPPLE, LEFT BREAST, BIOPSY:  -    Negative for malignancy.  -    Benign nipple duct with surrounding fibrous stroma.   B.   BREAST, LEFT, MASTECTOMY:  -    Ductal carcinoma in situ (DCIS), grade II (intermediate grade),  micropapillary and  cribriform patterns, with central necrosis and microcalcifications.  -    Margins:  -Small focus (0.2 mm) of cauterized probable DCIS present at the resection inked margin of the smaller unoriented tissue portion (slide B16).  -    Prior biopsy site changes.  -    Microcalcifications associated with non-neoplastic tissue.   C.   BREAST, RIGHT, MASTECTOMY:  -    Atypical duct hyperplasia (ADH), 1.5 mm focus, 2 mm from anterior resection margin.  -    Changes compatible with prior biopsy site scar.  -    No malignancy identified.     Genetic Testing   [facility] CancerNext-Expanded Panel was Negative. Report date is 11/24/2021.  The CancerNext-Expanded gene panel offered by [facility] and includes sequencing, rearrangement, and RNA analysis for the following 77 genes: AIP, ALK, APC, ATM, AXIN2, BAP1, BARD1, BLM, BMPR1A, BRCA1, BRCA2, BRIP1, CDC73, CDH1, CDK4, CDKN1B, CDKN2A, CHEK2, CTNNA1, DICER1, FANCC, FH, FLCN, GALNT12, KIF1B, LZTR1, MAX, MEN1, MET, MLH1, MSH2, MSH3, MSH6, MUTYH, NBN, NF1, NF2, NTHL1, PALB2, PHOX2B, PMS2, POT1, PRKAR1A, PTCH1, PTEN, RAD51C, RAD51D, RB1, RECQL,  RET, SDHA, SDHAF2, SDHB, SDHC, SDHD, SMAD4, SMARCA4, SMARCB1, SMARCE1, STK11, SUFU, TMEM127, TP53, TSC1, TSC2, VHL and XRCC2 (sequencing and deletion/duplication); EGFR, EGLN1, HOXB13, KIT, MITF, PDGFRA, POLD1, and POLE (sequencing only); EPCAM and GREM1 (deletion/duplication only).       INTERVAL HISTORY:  MAYCI HANING is here for a follow up of left breast DCIS  She was last seen by me on 11/08/2021 She presents to the clinic by husband. Pt has some concerns about the reconstruction surgery.Pt felt a lump on her right side.Pt notice the lump few weeks ago.Pt complain of tightness of the skin after surgery.Pt states all her concern are on the right side.    All other systems were reviewed with the patient and are negative.  MEDICAL HISTORY:  Past Medical History:  Diagnosis Date   Anxiety 2010   Breast cancer ([place]) 07/20/2021   left breast DCIS   Complication of anesthesia    Depression 2010   Hypothyroidism (acquired)    PONV (postoperative nausea and vomiting)    Thyroid disease 2010    SURGICAL HISTORY: Past Surgical History:  Procedure Laterality Date   ABDOMINAL HYSTERECTOMY  2001   BREAST RECONSTRUCTION WITH PLACEMENT OF TISSUE EXPANDER AND FLEX HD (ACELLULAR HYDRATED DERMIS) Bilateral 10/15/2021   Procedure: BREAST RECONSTRUCTION WITH PLACEMENT OF TISSUE EXPANDER AND FLEX HD (ACELLULAR HYDRATED DERMIS);  Surgeon: Cindra Presume, MD;  Location: [facility];  Service: Clinical cytogeneticist;  Laterality: Bilateral;   foot surgery     NIPPLE SPARING MASTECTOMY Bilateral 10/15/2021   Procedure: BILATERAL NIPPLE SPARING MASTECTOMY;  Surgeon: Coralie Keens, MD;  Location: [facility];  Service: General;  Laterality: Bilateral;   REMOVAL OF BILATERAL TISSUE EXPANDERS WITH PLACEMENT OF BILATERAL BREAST IMPLANTS Bilateral 01/11/2022   Procedure: REMOVAL OF BILATERAL TISSUE EXPANDERS WITH PLACEMENT OF BILATERAL BREAST IMPLANTS;  Surgeon: Cindra Presume, MD;   Location: [facility];  Service: Plastics;  Laterality: Bilateral;    I have reviewed the social history and family history with the patient and they are unchanged from previous note.  ALLERGIES:  has No Known Allergies.  MEDICATIONS:  Current Outpatient Medications  Medication Sig Dispense Refill   buPROPion (WELLBUTRIN SR) 150 MG 12 hr tablet Take 150 mg by mouth 2 (two) times daily.     FLUoxetine (PROZAC) 20 MG capsule Take by mouth every other day.     levothyroxine (SYNTHROID) 75 MCG tablet Take 75 mcg by mouth daily.     No current facility-administered medications for this visit.    PHYSICAL EXAMINATION: ECOG PERFORMANCE STATUS: {CHL ONC ECOG PS:[id number]}  There were no vitals filed for this visit. Wt Readings from Last 3 Encounters:  01/11/22 183 lb 10.3 oz (83.3 kg)  01/02/22 185 lb (83.9 kg)  11/08/21 188 lb 8 oz (85.5 kg)     GENERAL:alert, no distress and comfortable SKIN: skin color, texture, turgor are normal, no rashes or significant lesions EYES: normal, Conjunctiva are pink and non-injected, sclera clear NECK: supple, thyroid normal size, non-tender, without nodularity LYMPH:  no palpable lymphadenopathy in the cervical, axillary  LUNGS: clear to auscultation and percussion with normal breathing effort HEART: regular rate & rhythm and no murmurs and no lower extremity edema ABDOMEN:abdomen soft, non-tender and normal bowel sounds Musculoskeletal:no cyanosis of digits and no clubbing  NEURO: alert & oriented x 3 with fluent speech, no focal motor/sensory deficits  LABORATORY DATA:  I have reviewed the data as listed     No data to display               No data to display            RADIOGRAPHIC STUDIES: I have personally reviewed the radiological images as listed and agreed with the findings in the report. No results found.    No orders of the defined types were placed in this encounter.  All questions were answered. The  patient knows to call the clinic with any problems, questions or concerns. No barriers to learning was detected. The total time spent in the appointment was {CHL ONC TIME VISIT - OJJKK:[id number]}.     Baldemar Friday, CMA 07/02/2022   I, Audry Riles, CMA, am acting as scribe for Truitt Merle, MD.   {Add scribe attestation statement}

## 2022-07-03 ENCOUNTER — Encounter: Payer: Self-pay | Admitting: Hematology

## 2022-08-07 ENCOUNTER — Ambulatory Visit: Payer: BC Managed Care – PPO | Admitting: Plastic Surgery

## 2022-08-12 ENCOUNTER — Ambulatory Visit: Payer: BC Managed Care – PPO | Admitting: Plastic Surgery

## 2022-08-27 ENCOUNTER — Ambulatory Visit: Payer: BC Managed Care – PPO | Admitting: Plastic Surgery

## 2022-08-27 ENCOUNTER — Encounter: Payer: Self-pay | Admitting: Plastic Surgery

## 2022-08-27 VITALS — BP 131/79 | HR 81

## 2022-08-27 DIAGNOSIS — C50912 Malignant neoplasm of unspecified site of left female breast: Secondary | ICD-10-CM | POA: Diagnosis not present

## 2022-08-27 NOTE — Progress Notes (Signed)
   Subjective:    Patient ID: Carmen Gordon, female    DOB: 1960/12/11, 62 y.o.   MRN: [id number]  The patient is a 62 year old female who underwent breast reconstruction after mastectomy by Dr. Ninfa Linden.  The mastectomy with expander placement was March 2023.  She then had exchange of the expanders for gel implants in June.  She has high-profile 490 cc implants in place.  She states she is pleased with the reconstruction.  There is a little bit of asymmetry but overall she is doing really well.        Review of Systems  Constitutional: Negative.   Eyes: Negative.   Respiratory: Negative.    Cardiovascular: Negative.   Gastrointestinal: Negative.   Endocrine: Negative.   Genitourinary: Negative.   Musculoskeletal: Negative.        Objective:   Physical Exam Vitals reviewed.  Constitutional:      Appearance: Normal appearance.  HENT:     Head: Atraumatic.  Cardiovascular:     Rate and Rhythm: Normal rate.     Pulses: Normal pulses.  Pulmonary:     Effort: Pulmonary effort is normal.  Musculoskeletal:        General: No swelling or deformity.  Skin:    General: Skin is warm.     Capillary Refill: Capillary refill takes less than 2 seconds.     Coloration: Skin is not jaundiced.     Findings: No bruising.  Neurological:     Mental Status: She is alert and oriented to person, place, and time.  Psychiatric:        Mood and Affect: Mood normal.        Behavior: Behavior normal.        Thought Content: Thought content normal.        Judgment: Judgment normal.           Assessment & Plan:     ICD-10-CM   1. Ductal carcinoma of left breast ([place])  C50.912        We talked about evaluating the implants every 3 years with ultrasound.  I also would like to see her in 1 year and follow-up with her reconstruction.  If she has any questions or concerns she knows to contact us sooner.  The patient is pleased with her results and will let us know if she has any  questions or concerns.  Pictures were obtained of the patient and placed in the chart with the patient's or guardian's permission.

## 2022-09-11 ENCOUNTER — Other Ambulatory Visit (HOSPITAL_COMMUNITY)
Admission: RE | Admit: 2022-09-11 | Discharge: 2022-09-11 | Disposition: A | Discharge status: Home / Self Care | Payer: BC Managed Care – PPO | Source: Ambulatory Visit | Attending: Plastic Surgery | Admitting: Plastic Surgery

## 2022-09-11 ENCOUNTER — Encounter: Payer: Self-pay | Admitting: Plastic Surgery

## 2022-09-11 ENCOUNTER — Ambulatory Visit: Payer: BC Managed Care – PPO | Admitting: Plastic Surgery

## 2022-09-11 VITALS — BP 150/92 | HR 70

## 2022-09-11 DIAGNOSIS — L989 Disorder of the skin and subcutaneous tissue, unspecified: Secondary | ICD-10-CM | POA: Insufficient documentation

## 2022-09-11 DIAGNOSIS — L280 Lichen simplex chronicus: Secondary | ICD-10-CM | POA: Diagnosis not present

## 2022-09-11 NOTE — Addendum Note (Signed)
Addended by: Wallace Going on: 09/11/2022 03:54 PM   Modules accepted: Orders

## 2022-09-11 NOTE — Progress Notes (Signed)
Procedure Note  Preoperative Dx: changing skin lesion of left breast  Postoperative Dx: Same  Procedure: biopsy of changing skin lesion of left breast 3 mm  Anesthesia: Lidocaine 1% with 1:100,000 epinephrine  Indication for Procedure: skin lesion  Description of Procedure: Risks and complications were explained to the patient.  Consent was confirmed and the patient understands the risks and benefits.  The potential complications and alternatives were explained and the patient consents.  The patient expressed understanding the option of not having the procedure and the risks of a scar.  Time out was called and all information was confirmed to be correct.    The area was prepped and drapped.  Lidocaine 1% with epinephrine was injected in the subcutaneous area.  After waiting several minutes for the local to take affect a #3 biopsy was used to obtain the specimen.  The skin edges were reapproximated with 6-0 Monocryl.  A dressing was applied.  The patient was given instructions on how to care for the area and a follow up appointment.  Chalet tolerated the procedure well and there were no complications. The specimen was sent to pathology.

## 2022-09-13 LAB — SURGICAL PATHOLOGY

## 2022-09-16 ENCOUNTER — Telehealth: Payer: Self-pay | Admitting: *Deleted

## 2022-09-16 NOTE — Telephone Encounter (Signed)
Spoke to patient and updated her on pathology results per Dr. Marla Roe:  From: Wallace Going, DO  Sent: 09/14/2022  10:34 AM EST  To: Lilli Light, RN; Harl Bowie, CMA   Please let pt know it is a dermatitis.  Not cancer. See dermatology.   Pt states she would like a referral to dermatology. States she has seen a dermatologist in the past but he has retired. Will discuss with Dr. Marla Roe and update patient.

## 2022-09-16 NOTE — Telephone Encounter (Signed)
Called pt to update on path results per Dr. Marla Roe. NA/LVM

## 2022-09-17 ENCOUNTER — Encounter: Payer: Self-pay | Admitting: *Deleted

## 2022-09-17 NOTE — Addendum Note (Signed)
Addended by: Lilli Light on: 09/17/2022 09:04 AM   Modules accepted: Orders

## 2022-09-20 ENCOUNTER — Ambulatory Visit (INDEPENDENT_AMBULATORY_CARE_PROVIDER_SITE_OTHER): Payer: BC Managed Care – PPO | Admitting: Surgical

## 2022-09-20 DIAGNOSIS — L989 Disorder of the skin and subcutaneous tissue, unspecified: Secondary | ICD-10-CM

## 2022-09-20 NOTE — Progress Notes (Signed)
Patient is a 62 year old female here for biopsy of changing skin lesion of her left breast 3 mm with Dr. Marla Roe on 09/11/2022.  The area was closed with a 6-0 Monocryl.  The patient was subsequently referred to dermatology for ongoing management as the pathology resulted with: Lichenoid dermatitis, negative for malignancy.  She is doing well. No issues.  On exam, left breast incision in-tact.  It is healing very well.  There is no signs of erythema or cellulitic changes.  No tenderness noted with palpation.  A/P:  Monocryl suture was removed, patient tolerated this well.  She has been referred to dermatology by Dr. Marla Roe.  We will plan to see patient as needed for follow-up in regards to this.  We will continue to follow her in relation to her breast reconstruction.  No signs of infection

## 2022-11-01 ENCOUNTER — Ambulatory Visit (INDEPENDENT_AMBULATORY_CARE_PROVIDER_SITE_OTHER): Payer: Self-pay | Admitting: Surgical

## 2022-11-01 DIAGNOSIS — Z411 Encounter for cosmetic surgery: Secondary | ICD-10-CM

## 2022-11-01 NOTE — Progress Notes (Signed)
Botulinum Toxin Procedure Note  Procedure: Cosmetic botulinum toxin  Pre-operative Diagnosis: Dynamic rhytides  Post-operative Diagnosis: Same  Complications:  None  Brief history: The patient desires botulinum toxin injection.  She is aware of the risks including bleeding, damage to deeper structures, asymmetry, brow ptosis, eyelid ptosis, bruising. The patient understands and wishes to proceed.  Procedure: The area was prepped with alcohol and dried with a clean gauze.  Using a clean technique the botulinum toxin was diluted with 2.5 mL of bacteriostatic saline per 100 unit vial which resulted in 4 units per 0.1 mL.  Subsequently the mixture was injected in the glabellar, lateral canthal lines, forehead area with preservation of the temporal branch to the lateral eyebrow.   Physical Exam HENT:     Head:      A total of 28 Units of botulinum toxin was used. The forehead and glabellar area was injected with care to inject intramuscular only while holding pressure on the supratrochlear vessels in each area during each injection on either side of the medial corrugators. The injection proceeded vertically superiorly to the medial 2/3 of the frontalis muscle and superior 2/3 of the lateral frontalis, again with preservation of the frontal branch.  No complications were noted. Light pressure was held for 5 minutes. She was instructed explicitly in post-operative care.  Botox LOT:  [id number] EXP:  10/2024

## 2022-11-04 ENCOUNTER — Encounter: Payer: Self-pay | Admitting: Dermatology

## 2022-11-04 ENCOUNTER — Ambulatory Visit: Payer: BC Managed Care – PPO | Admitting: Dermatology

## 2022-11-04 VITALS — BP 119/75

## 2022-11-04 DIAGNOSIS — D3612 Benign neoplasm of peripheral nerves and autonomic nervous system, upper limb, including shoulder: Secondary | ICD-10-CM

## 2022-11-04 DIAGNOSIS — L280 Lichen simplex chronicus: Secondary | ICD-10-CM

## 2022-11-04 DIAGNOSIS — L578 Other skin changes due to chronic exposure to nonionizing radiation: Secondary | ICD-10-CM | POA: Diagnosis not present

## 2022-11-04 DIAGNOSIS — D485 Neoplasm of uncertain behavior of skin: Secondary | ICD-10-CM

## 2022-11-04 NOTE — Patient Instructions (Signed)
Patient Handout: Wound Care for Skin Biopsy Site  Patient Handout: Wound Care for Skin Biopsy Site  Taking Care of Your Skin Biopsy Site  Proper care of the biopsy site is essential for promoting healing and minimizing scarring. This handout provides instructions on how to care for your biopsy site to ensure optimal recovery.  1. Cleaning the Wound:  Clean the biopsy site daily with gentle soap and water. Gently pat the area dry with a clean, soft towel. Avoid harsh scrubbing or rubbing the area, as this can irritate the skin and delay healing.  2. Applying Aquaphor and Bandage:  After cleaning the wound, apply a thin layer of Aquaphor ointment to the biopsy site. Cover the area with a sterile bandage to protect it from dirt, bacteria, and friction. Change the bandage daily or as needed if it becomes soiled or wet.  3. Continued Care for One Week:  Repeat the cleaning, Aquaphor application, and bandaging process daily for one week following the biopsy procedure. Keeping the wound clean and moist during this initial healing period will help prevent infection and promote optimal healing.  4. Massaging Aquaphor into the Area:  ---After one week, discontinue the use of bandages but continue to apply Aquaphor to the biopsy site. ----Gently massage the Aquaphor into the area using circular motions. ---Massaging the skin helps to promote circulation and prevent the formation of scar tissue.   Additional Tips:  Avoid exposing the biopsy site to direct sunlight during the healing process, as this can cause hyperpigmentation or worsen scarring. If you experience any signs of infection, such as increased redness, swelling, warmth, or drainage from the wound, contact your healthcare provider immediately. Follow any additional instructions provided by your healthcare provider for caring for the biopsy site and managing any discomfort. Conclusion:  Taking proper care of your skin biopsy site  is crucial for ensuring optimal healing and minimizing scarring. By following these instructions for cleaning, applying Aquaphor, and massaging the area, you can promote a smooth and successful recovery. If you have any questions or concerns about caring for your biopsy site, don't hesitate to contact your healthcare provider for guidance.      Due to recent changes in healthcare laws, you may see results of your pathology and/or laboratory studies on MyChart before the doctors have had a chance to review them. We understand that in some cases there may be results that are confusing or concerning to you. Please understand that not all results are received at the same time and often the doctors may need to interpret multiple results in order to provide you with the best plan of care or course of treatment. Therefore, we ask that you please give us 2 business days to thoroughly review all your results before contacting the office for clarification. Should we see a critical lab result, you will be contacted sooner.   If You Need Anything After Your Visit  If you have any questions or concerns for your doctor, please call our main line at [phone] If no one answers, please leave a voicemail as directed and we will return your call as soon as possible. Messages left after 4 pm will be answered the following business day.   You may also send us a message via MyChart. We typically respond to MyChart messages within 1-2 business days.  For prescription refills, please ask your pharmacy to contact our office. Our fax number is [phone].  If you have an urgent issue when the clinic is   closed that cannot wait until the next business day, you can page your doctor at the number below.    Please note that while we do our best to be available for urgent issues outside of office hours, we are not available 24/7.   If you have an urgent issue and are unable to reach us, you may choose to seek medical care  at your doctor's office, retail clinic, urgent care center, or emergency room.  If you have a medical emergency, please immediately call 911 or go to the emergency department. In the event of inclement weather, please call our main line at [phone] for an update on the status of any delays or closures.  Dermatology Medication Tips: Please keep the boxes that topical medications come in in order to help keep track of the instructions about where and how to use these. Pharmacies typically print the medication instructions only on the boxes and not directly on the medication tubes.   If your medication is too expensive, please contact our office at [phone] or send us a message through MyChart.   We are unable to tell what your co-pay for medications will be in advance as this is different depending on your insurance coverage. However, we may be able to find a substitute medication at lower cost or fill out paperwork to get insurance to cover a needed medication.   If a prior authorization is required to get your medication covered by your insurance company, please allow us 1-2 business days to complete this process.  Drug prices often vary depending on where the prescription is filled and some pharmacies may offer cheaper prices.  The website www.goodrx.com contains coupons for medications through different pharmacies. The prices here do not account for what the cost may be with help from insurance (it may be cheaper with your insurance), but the website can give you the price if you did not use any insurance.  - You can print the associated coupon and take it with your prescription to the pharmacy.  - You may also stop by our office during regular business hours and pick up a GoodRx coupon card.  - If you need your prescription sent electronically to a different pharmacy, notify our office through [facility] MyChart or by phone at [phone]     

## 2022-11-04 NOTE — Progress Notes (Deleted)
   New Patient Visitf left breast   Subjective  Carmen Gordon is a 62 y.o. 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 Spot of left arm x ~2 years that seems to be getting bigger.    The following portions of the chart were reviewed this encounter and updated as appropriate: medications, allergies, medical history  Review of Systems:  No other skin or systemic complaints except as noted in HPI or Assessment and Plan.  Objective  Well appearing patient in no apparent distress; mood and affect are within normal limits.  ***A full examination was performed including scalp, head, eyes, ears, nose, lips, neck, chest, axillae, abdomen, back, buttocks, bilateral upper extremities, bilateral lower extremities, hands, feet, fingers, toes, fingernails, and toenails. All findings within normal limits unless otherwise noted below.  ***A focused examination was performed of the following areas: ***  Relevant exam findings are noted in the Assessment and Plan.    Assessment & Plan       No follow-ups on file.  ***  Documentation: I have reviewed the above documentation for accuracy and completeness, and I agree with the above.  Ellard Artis, MD

## 2022-11-04 NOTE — Progress Notes (Unsigned)
   New Patient Visit   Subjective  Carmen Gordon is a 62 y.o. 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 At her post op from her mastectomy. Dr Marla Roe noticed the spot She also has a spot on her left forearm x~2 years that seems a little bigger.  Family history of Melanoma in maternal uncle.  The following portions of the chart were reviewed this encounter and updated as appropriate: medications, allergies, medical history  Review of Systems:  No other skin or systemic complaints except as noted in HPI or Assessment and Plan.  Objective  Well appearing patient in no apparent distress; mood and affect are within normal limits.   A focused examination was performed of the following areas: chest, left forearm   Relevant exam findings are noted in the Assessment and Plan.  Left forearm 6 mm pink soft papule    Assessment & Plan     Neoplasm of uncertain behavior of skin Left forearm  Epidermal / dermal shaving  Lesion diameter (cm):  0.6 Informed consent: discussed and consent obtained   Timeout: patient name, date of birth, surgical site, and procedure verified   Procedure prep:  Patient was prepped and draped in usual sterile fashion Prep type:  Isopropyl alcohol Anesthesia: the lesion was anesthetized in a standard fashion   Anesthetic:  1% lidocaine w/ epinephrine 1-100,000 buffered w/ 8.4% NaHCO3 Instrument used: flexible razor blade   Hemostasis achieved with: pressure, aluminum chloride and electrodesiccation   Outcome: patient tolerated procedure well   Post-procedure details: sterile dressing applied and wound care instructions given   Dressing type: bandage and petrolatum      No follow-ups on file.  I, Ashok Cordia, CMA, am acting as scribe for Ellard Artis, MD .   Documentation: I have reviewed the above documentation for accuracy and completeness, and I agree with the above.  Ellard Artis, MD

## 2023-01-02 ENCOUNTER — Ambulatory Visit (INDEPENDENT_AMBULATORY_CARE_PROVIDER_SITE_OTHER): Payer: Self-pay | Admitting: Surgical

## 2023-01-02 DIAGNOSIS — Z411 Encounter for cosmetic surgery: Secondary | ICD-10-CM

## 2023-01-02 NOTE — Progress Notes (Signed)
Botulinum Toxin Procedure Note  Procedure: Cosmetic botulinum toxin  Pre-operative Diagnosis: Dynamic rhytides  Post-operative Diagnosis: Same  Complications:  None  Brief history: The patient desires botulinum toxin injection.  She is aware of the risks including bleeding, damage to deeper structures, asymmetry, brow ptosis, eyelid ptosis, bruising. The patient understands and wishes to proceed.  Procedure: The area was prepped with alcohol and dried with a clean gauze.  Using a clean technique the botulinum toxin was diluted with 2.5 mL of bacteriostatic saline per 100 unit vial which resulted in 4 units per 0.1 mL.  Subsequently the mixture was injected in the glabellar, lateral canthal lines, forehead area with preservation of the temporal branch to the lateral eyebrow. A total of 28 Units of botulinum toxin was used. The forehead and glabellar area was injected with care to inject intramuscular only while holding pressure on the supratrochlear vessels in each area during each injection on either side of the medial corrugators. The injection proceeded vertically superiorly to the medial 2/3 of the frontalis muscle and superior 2/3 of the lateral frontalis, again with preservation of the frontal branch.  Physical Exam HENT:     Head:       No complications were noted. Light pressure was held for 5 minutes. She was instructed explicitly in post-operative care.  Botox LOT:  C [id number] C4 EXP: 12/2024

## 2023-02-25 NOTE — Progress Notes (Signed)
 PROVIDER:  VICENTA DASIE POLI, MD  MRN: [id number] DOB: 1961/06/23 DATE OF ENCOUNTER: 02/25/2023 Subjective     Chief Complaint: Follow-up     History of Present Illness: Carmen Gordon is a 62 y.o. female who is seen today for a long-term follow-up regarding her history of left breast ductal carcinoma in situ.  She is status post nipple sparing mastectomies in March 2023.  She reports she is doing very well now and has minimal complaints other than mild discomfort on the right breast.  She is not on antihormonal treatment.  She has had no changes in her health..     Review of Systems: A complete review of systems was obtained from the patient.  I have reviewed this information and discussed as appropriate with the patient.  See HPI as well for other ROS.  ROS    Medical History: Past Medical History:  Diagnosis Date  . Anxiety   . Thyroid disease     Patient Active Problem List  Diagnosis  . Anxiety  . Hypothyroidism  . Obstruction of left eustachian tube    Past Surgical History:  Procedure Laterality Date  . HYSTERECTOMY  2003     No Known Allergies  Current Outpatient Medications on File Prior to Visit  Medication Sig Dispense Refill  . buPROPion (WELLBUTRIN SR) 150 MG SR tablet Take by mouth    . FLUoxetine (PROZAC) 20 MG capsule     . levothyroxine (SYNTHROID) 50 MCG tablet Take 1 tablet by mouth once daily    . ZEPBOUND 7.5 mg/0.5 mL pen injector 7.5 MG ONCE WEEKLY BY SUBCUTANEOUS INJECTION FOR 4 WEEKS     No current facility-administered medications on file prior to visit.    History reviewed. No pertinent family history.   Social History   Tobacco Use  Smoking Status Never  Smokeless Tobacco Never     Social History   Socioeconomic History  . Marital status: Married  Tobacco Use  . Smoking status: Never  . Smokeless tobacco: Never  Vaping Use  . Vaping status: Unknown  Substance and Sexual Activity  . Alcohol use: Yes  . Drug  use: Never   Social Determinants of Health   Transportation Needs: No Transportation Needs (08/15/2020)   Received from [facility], [facility]   [facility] - Transportation   . Lack of Transportation (Medical): No   . Lack of Transportation (Non-Medical): No  Physical Activity: Sufficiently Active (02/09/2020)   Received from [facility], [facility]   Exercise Vital Sign   . Days of Exercise per Week: 5 days   . Minutes of Exercise per Session: 30 min    Objective:    Vitals:   02/25/23 1405  Weight: 70.6 kg (155 lb 9.6 oz)  Height: 167.6 cm (5' 6)  PainSc: 0-No pain  PainLoc: Breast    Body mass index is 25.11 kg/m.  Physical Exam   She looks well today  Her bilateral mastectomy incisions are well-healed.  Her nipple sparing mastectomies with reconstruction look normal.  There is still some mild discoloration underneath the areola on the left side.  There are no palpable masses and no axillary adenopathy on either side   Labs, Imaging and Diagnostic Testing:  I reviewed her notes in the electronic medical records.   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  At this point, there is no role for a follow-up MRI.  I recommend self examinations as well as yearly follow-up with her primary care provider and with me.  I will see her back in 1 year unless she has any palpable concerns.  If she has any concerns, she will call and we will determine how to evaluate these concerns.  He agrees with the plans.  Moderate medical decision making  Return in about 1 year (around 02/25/2024).   VICENTA DASIE POLI, MD

## 2023-05-28 IMAGING — MR MR ABDOMEN WO/W CM
11 of 18 series · 27 of 48 positions shown · IV contrast (multihance)
Comparison: Breast MRI 08/15/2021.

CLINICAL DATA: T2 hyperintense liver lesions on breast MRI.
Recently diagnosed left breast cancer.

EXAM:
MRI ABDOMEN WITHOUT AND WITH CONTRAST
TECHNIQUE: Multiplanar multisequence MR imaging of the abdomen was performed
both before and after the administration of intravenous contrast.
CONTRAST:  17mL MULTIHANCE GADOBENATE DIMEGLUMINE 529 MG/ML IV SOLN

[Series 3: cor haste · coronal · 5.0mm · 0.68mm/px · 2 of 32 slices shown]
[im 1/32]
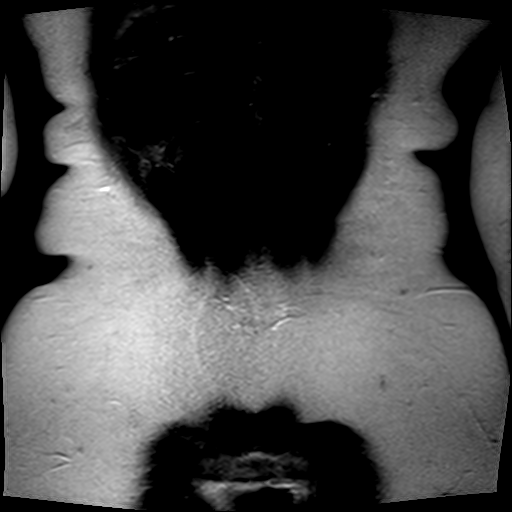
[im 32/32]
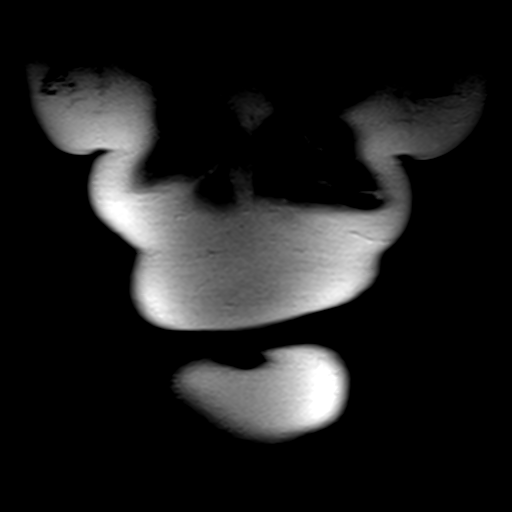

[Series 4: axial haste · axial · 6.0mm · 0.68mm/px · z∈[-83,+128]mm · 2 of 33 slices shown]
[im 1/33]
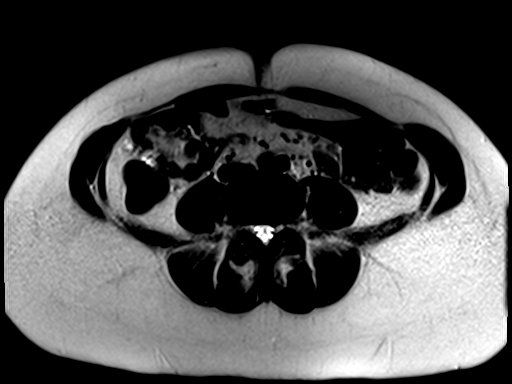
[im 33/33]
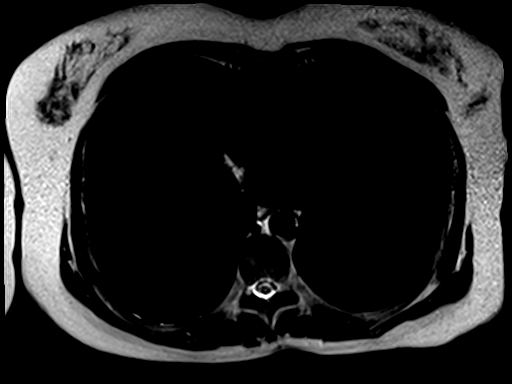

[Series 5: T1 · axial · 6.0mm · 0.68mm/px · z∈[-95,+136]mm · 4 of 72 slices shown]
[im 1/72]
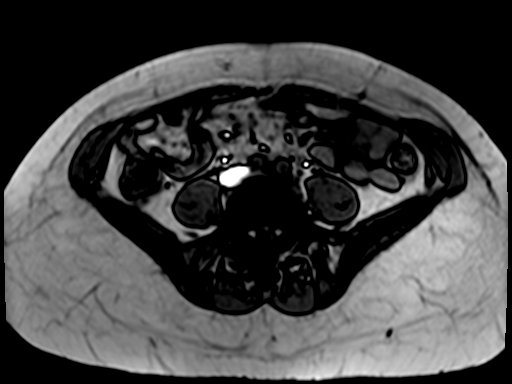
[im 24/72]
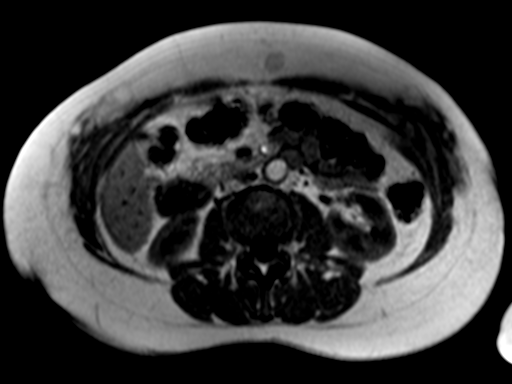
[im 48/72]
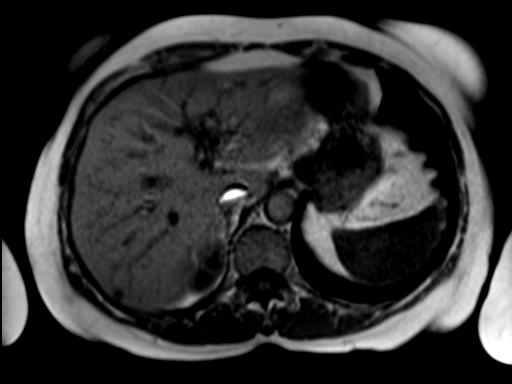
[im 72/72]
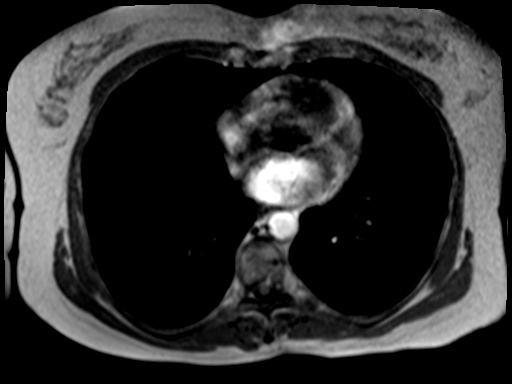

[Series 6: bSSFP · axial · 4.0mm · 0.68mm/px · z∈[-99,-55]mm · 2 of 12 slices shown (1 of 2)]
[im 1/12]
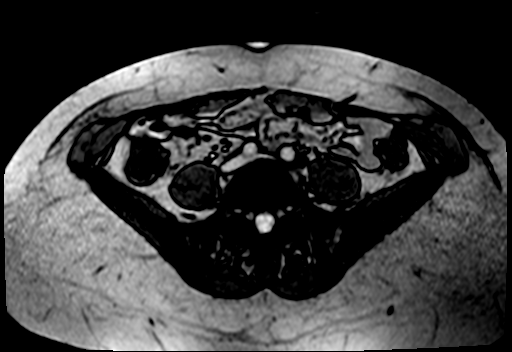
[im 12/12]
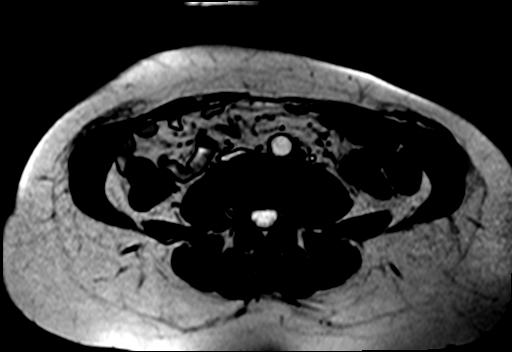

[Series 7: bSSFP · axial · 4.0mm · 0.68mm/px · z∈[-99,+141]mm · 2 of 61 slices shown (2 of 2)]
[im 1/61]
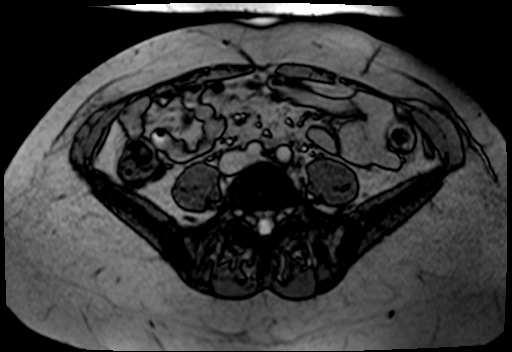
[im 61/61]
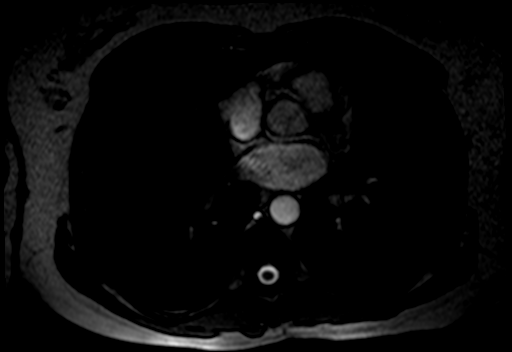

[Series 8: T2 fat-sat · axial · 6.0mm · 1.09mm/px · 1 of 35 slices shown]
[im 1/35]
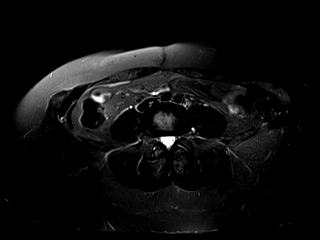

[Series 9: ep2d_diff_b50_500_800_p2_trig · axial · 6.0mm · 1.82mm/px · z∈[-72,+158]mm · 4 of 99 slices shown]
[im 1/99]
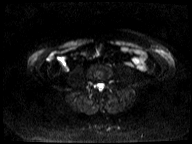
[im 33/99]
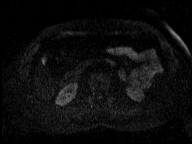
[im 66/99]
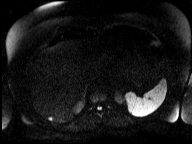
[im 99/99]
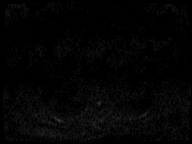

[Series 10: ep2d_diff_b50_500_800_p2_trig_adc · axial · 6.0mm · 1.82mm/px · 1 of 33 slices shown]
[im 1/33]
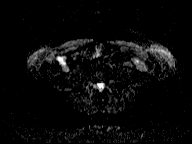

[Series 11: T1 dynamic · axial · non-contrast · 2.5mm · 0.74mm/px · z∈[-79,+138]mm · 3 of 88 slices shown]
[im 1/88]
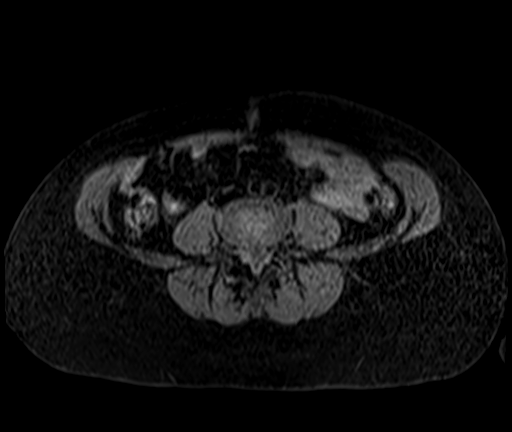
[im 44/88]
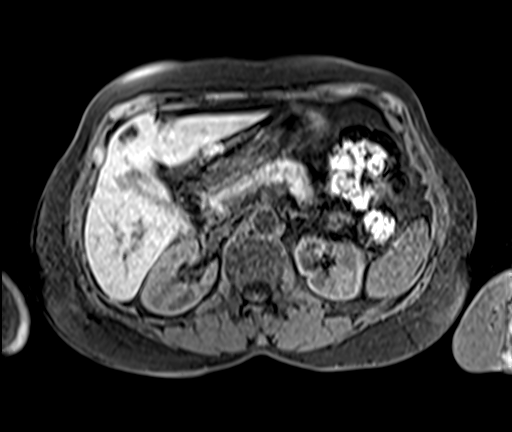
[im 88/88]
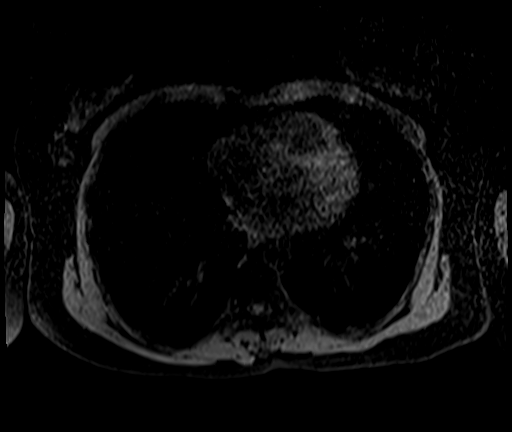

[Series 12: T1 dynamic post-contrast · axial · 2.5mm · 0.74mm/px · z∈[-79,+138]mm · 3 of 88 slices shown (1 of 2)]
[im 1/88]
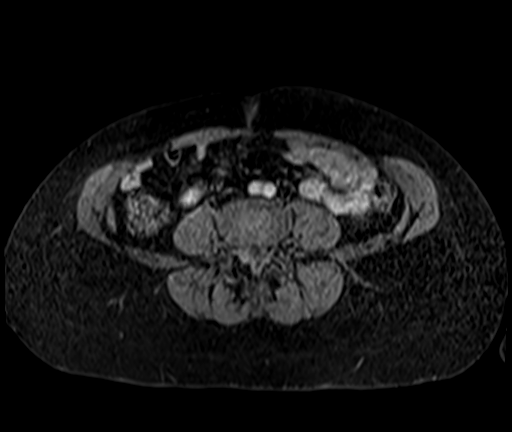
[im 44/88]
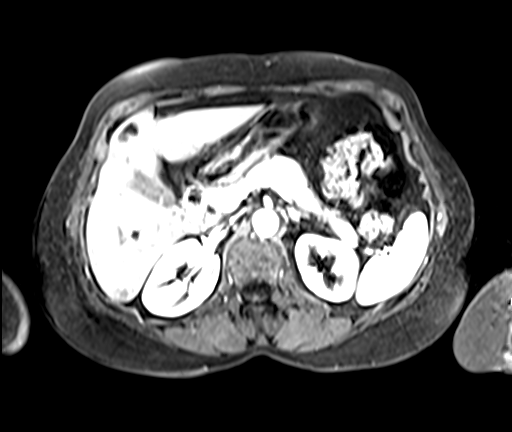
[im 88/88]
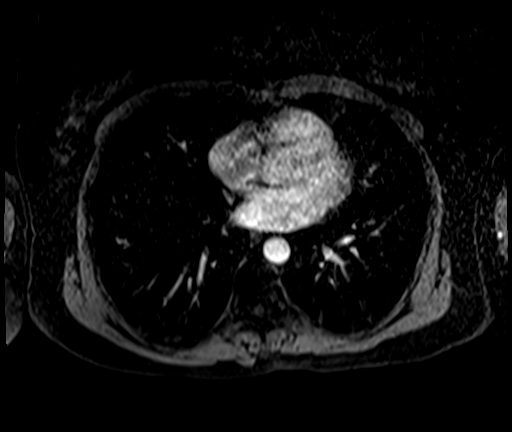

[Series 13: T1 dynamic post-contrast · axial · 2.5mm · 0.74mm/px · z∈[-79,+138]mm · 3 of 88 slices shown (2 of 2)]
[im 1/88]
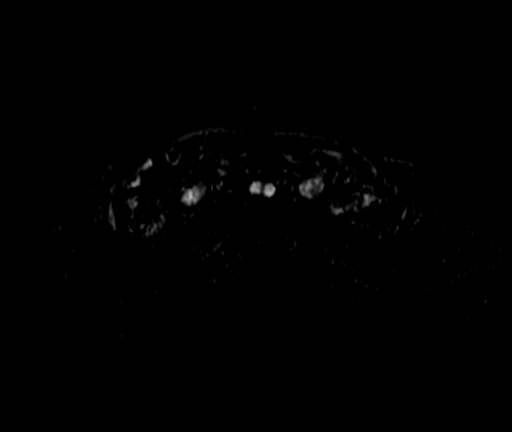
[im 44/88]
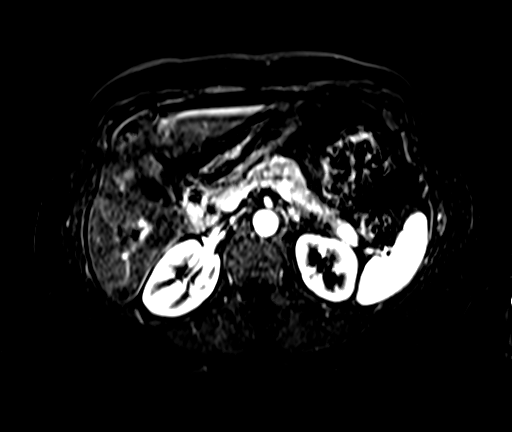
[im 88/88]
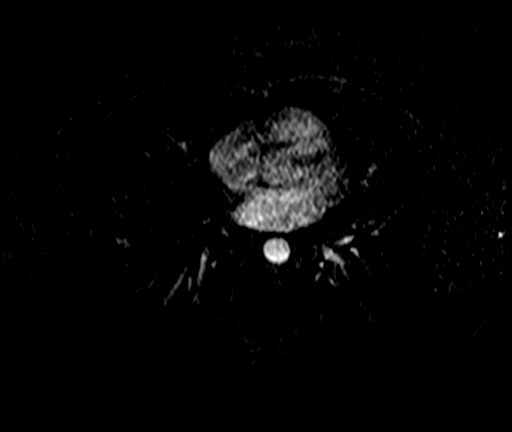

[27 of 48 positions shown; findings below may reference images not displayed]

FINDINGS: Lower chest:  The visualized lower chest appears unremarkable.

Hepatobiliary: The hepatic contours are normal. There is no loss of
signal on the gradient echo opposed phase images to suggest
steatosis. Again demonstrated are numerous well-circumscribed T2
hyperintense lesions without enhancement, consistent with cysts or
biliary hamartomas. Largest is located posteriorly in the right
hepatic lobe (segment 6), measuring 2.8 cm in diameter. Just
superior to this, there is a 6 mm T2 hyperintense lesion on image
[DATE] which demonstrates peripheral discontinuous enhancement
following contrast and becomes isointense to blood pool on the
delayed phase images, consistent with a hemangioma. There is a
lesion in the left hepatic lobe (segment [DATE]) which demonstrates
arterial phase enhancement, measuring 1.6 x 1.4 cm on image [DATE].
This lesion is barely perceptible prior to contrast administration,
demonstrating mild increased signal on the diffusion-weighted
images. This lesion remains isointense to blood pool on the delayed
phase images. Features favor focal nodular hyperplasia. No lesions
suspicious for metastatic disease identified. No evidence of
gallstones, gallbladder wall thickening or biliary dilatation.

Pancreas: Unremarkable. No pancreatic ductal dilatation or
surrounding inflammatory changes.

Spleen: Normal in size without focal abnormality.

Adrenals/Urinary Tract: Both adrenal glands appear normal. Both
kidneys appear unremarkable. No hydronephrosis.

Stomach/Bowel: No evidence of bowel distension, inflammation or
surrounding inflammation.

Vascular/Lymphatic: There are no enlarged abdominal lymph nodes. No
significant vascular findings.

Other: No evidence of abdominal wall hernia or ascites.

Musculoskeletal: No acute or significant osseous findings.
IMPRESSION: 1. No findings suspicious for abdominal metastatic disease.
2. Multiple simple hepatic cysts or biliary hamartomas. In addition,
there is a small enhancing lesion in the right lobe which is most
consistent with a hemangioma and another enhancing lesion in the
left lobe which probably reflects focal nodular hyperplasia.

## 2024-06-28 ENCOUNTER — Encounter (INDEPENDENT_AMBULATORY_CARE_PROVIDER_SITE_OTHER): Payer: Self-pay | Admitting: *Deleted

## 2024-06-28 ENCOUNTER — Other Ambulatory Visit (HOSPITAL_COMMUNITY): Payer: Self-pay | Admitting: Internal Medicine

## 2024-06-28 DIAGNOSIS — M810 Age-related osteoporosis without current pathological fracture: Secondary | ICD-10-CM

## 2024-08-24 NOTE — Progress Notes (Signed)
 "  Hematology-Oncology Clinic Note  Carmen Vaughn FALCON, MD   Reason for Referral: History of DCIS  Oncology History: I have reviewed her chart and materials related to her cancer extensively and collaborated history with the patient. Summary of oncologic history is as follows:  Diagnosis: History of DCIS  -05/21/2021: Screening Mammogram: Further evaluation is suggested for calcifications in the left breast.  -07/04/2021: Left Diagnostic Mammogram: Indeterminate microcalcifications over the inner midportion of the left breast spanning 5.3 cm.  -07/20/2021: Left breast mass biopsy.  Pathology: Intermediate grade ductal carcinoma in situ, cribriform type with calcifications. Tumor is ER positive (100%) and PR positive (60%).  -08/15/2021: MRI Bilateral Breast: Subtle non-mass enhancement involving the entirety of the lower inner quadrant of the left breast corresponding with the patient's biopsy-proven DCIS. This measures approximately 7 x 4 x 3 cm (AP by transverse by craniocaudal dimensions). Indeterminate 4 mm enhancing focus in the central lateral right breast (series 9, image 60/144). Recommendation is for MRI guided biopsy. Indeterminate 5-6 mm low lying left axillary lymph node without clear fatty hilum. -08/31/2021: US  of Bilateral Breast: No sonographic correlate for the MRI finding in the RIGHT breast. No LEFT axillary adenopathy. -09/07/2021: Right breast biopsy.   Pathology: No malignancy identified. -10/15/2021: Bilateral Mastectomy.  -Right breast: Atypical duct hyperplasia (ADH), 1.5 mm focus, 2 mm from anterior resection margin. No malignancy identified.  -Left breast: Intermediate grade DCIS, micropapillary and cribriform patterns, with central necrosis and microcalcifications. Margins involved by DCIS, closest 0.2 mm. Staging: pTis  -Patient was discharged from oncology on 06/2022 and elected for annual follow-up with surgeon [Dr. Vernetta at [facility]]. She has had no breast  imaging done after her double mastectomy.    History of Presenting Illness: Discussed the use of AI scribe software for clinical note transcription with the patient, who gave verbal consent to proceed.  History of Present Illness Carmen Gordon is a 64 year old female with left ductal carcinoma in situ, status post bilateral mastectomy and breast reconstruction, who presents for oncology follow-up to discuss the role of hormonal therapy.  She was diagnosed with ductal carcinoma in situ in 2023 and underwent bilateral mastectomy with subsequent breast reconstruction. She did not receive adjuvant radiation therapy, as her surgical management was mastectomy. She has not received hormonal therapy and is seeking clarification regarding the indication for estrogen blockade, as this was previously discussed with other providers.  She denies any recurrence or new symptoms since surgery and reports she is not taking any medications related to her breast cancer. She reports no abnormal bleeding, bruising, or new palpable masses. She notes persistent loss of sensation in the breast area since reconstruction, without improvement.  She expresses ongoing anxiety and concern regarding recurrence, describing the diagnosis as life-altering. She has no family history of cancer and has not undergone imaging since surgery.   Medical History: Past Medical History:  Diagnosis Date   Anxiety 2010   Breast cancer (HCC) 07/20/2021   left breast DCIS   Complication of anesthesia    Depression 2010   Hypothyroidism (acquired)    PONV (postoperative nausea and vomiting)    Thyroid disease 2010    Surgical history: Past Surgical History:  Procedure Laterality Date   ABDOMINAL HYSTERECTOMY  2001   BREAST RECONSTRUCTION WITH PLACEMENT OF TISSUE EXPANDER AND FLEX HD (ACELLULAR HYDRATED DERMIS) Bilateral 10/15/2021   Procedure: BREAST RECONSTRUCTION WITH PLACEMENT OF TISSUE EXPANDER AND FLEX HD (ACELLULAR  HYDRATED DERMIS);  Surgeon: Elisabeth Craig RAMAN, MD;  Location:  [facility];  Service: Plastics;  Laterality: Bilateral;   foot surgery     NIPPLE SPARING MASTECTOMY Bilateral 10/15/2021   Procedure: BILATERAL NIPPLE SPARING MASTECTOMY;  Surgeon: Vernetta Berg, MD;  Location: [facility];  Service: General;  Laterality: Bilateral;   REMOVAL OF BILATERAL TISSUE EXPANDERS WITH PLACEMENT OF BILATERAL BREAST IMPLANTS Bilateral 01/11/2022   Procedure: REMOVAL OF BILATERAL TISSUE EXPANDERS WITH PLACEMENT OF BILATERAL BREAST IMPLANTS;  Surgeon: Elisabeth Craig RAMAN, MD;  Location: [facility];  Service: Plastics;  Laterality: Bilateral;     Allergies:  has no known allergies.  Medications:  Current Outpatient Medications  Medication Sig Dispense Refill   ZEPBOUND 5 MG/0.5ML Pen SMARTSIG:5 Milligram(s) SUB-Q Once a Week     buPROPion (WELLBUTRIN SR) 150 MG 12 hr tablet Take 150 mg by mouth 2 (two) times daily.     FLUoxetine (PROZAC) 20 MG capsule Take by mouth every other day.     levothyroxine (SYNTHROID) 75 MCG tablet Take 75 mcg by mouth daily.     No current facility-administered medications for this visit.     Physical Examination: ECOG PERFORMANCE STATUS: 0 - Asymptomatic  Vitals:   08/25/24 1341  BP: 123/73  Pulse: 71  Resp: 18  Temp: 98.1 F (36.7 C)  SpO2: 100%   Filed Weights   08/25/24 1341  Weight: 146 lb 4.8 oz (66.4 kg)    GENERAL:alert, no distress and comfortable SKIN: skin color, texture, turgor are normal, no rashes or significant lesions EYES: normal, conjunctiva are pink and non-injected, sclera clear OROPHARYNX:no exudate, no erythema and lips, buccal mucosa, and tongue normal  NECK: supple, thyroid normal size, non-tender, without nodularity LYMPH:  no palpable lymphadenopathy in the cervical, axillary or inguinal LUNGS: clear to auscultation and percussion with normal breathing effort HEART: regular rate & rhythm and no  murmurs and no lower extremity edema ABDOMEN:abdomen soft, non-tender and normal bowel sounds Musculoskeletal:no cyanosis of digits and no clubbing  PSYCH: alert & oriented x 3 with fluent speech NEURO: no focal motor/sensory deficits   Laboratory Data: I have reviewed the data as listed   Radiographic Studies: I have personally reviewed the radiological images as listed and agreed with the findings in the report.  MR RT BREAST BX W LOC DEV 1ST LESION IMAGE BX SPEC MR GUIDE Addendum: ADDENDUM REPORT: 09/12/2021 07:31   ADDENDUM:  Pathology revealed FIBROADENOMATOID NODULE of the RIGHT breast,  upper outer quadrant, (cylinder clip). This was found to be  concordant by Dr. Madeleine Satterfield.   Pathology results were discussed with the patient by telephone. The  patient reported doing well after the biopsy with tenderness and  bruising at the site. Post biopsy instructions and care were  reviewed and questions were answered. The patient was encouraged to  call [facility] for any additional  concerns. My direct phone number was provided.   The patient has a recent diagnosis of LEFT breast cancer and should  follow her outlined treatment plan.   Dr. Georgette Vernetta, Dr. Onita Mattock and Dr. Craig Elisabeth were notified  of biopsy results via EPIC message on September 10, 2021.   Pathology results reported by Hendricks Benders, RN on 09/10/2021.   Electronically Signed    By: Debby Satterfield M.D.    On: 09/12/2021 07:31 Narrative: CLINICAL DATA:  64 year old with biopsy-proven DCIS involving much of the UPPER INNER QUADRANT of the LEFT breast. Pretreatment MRI demonstrated an indeterminate 4 mm mass  in the outer RIGHT breast, UPPER OUTER QUADRANT, at posterior depth.  EXAM: MRI GUIDED CORE NEEDLE BIOPSY OF THE RIGHT BREAST  TECHNIQUE: Multiplanar, multisequence MR imaging of the RIGHT breast was performed both before and after administration of  intravenous contrast.  CONTRAST:  9 mL Gadavist  IV.  COMPARISON:  Previous exams.  FINDINGS: I met with the patient, and we discussed the procedure of MRI guided biopsy, including risks, benefits, and alternatives. Specifically, we discussed the risks of infection, bleeding, tissue injury, clip migration, and inadequate sampling. Informed, written consent was given. The usual time out protocol was performed immediately prior to the procedure.  Lesion quadrant: UPPER OUTER QUADRANT  Using sterile technique with chlorhexidine  as skin antisepsis, 1% lidocaine  and 1% lidocaine  with epinephrine  as local anesthetic, using MRI guidance, a 9 gauge vacuum assisted device was used perform biopsy of the mass in the UPPER OUTER QUADRANT using a lateral approach.  At the conclusion of the procedure, a cylinder shaped tissue marker clip was deployed into the biopsy cavity. Follow-up 2-view mammogram was performed and dictated separately.  IMPRESSION: MRI guided biopsy of an indeterminate 4-5 mm mass involving the UPPER OUTER QUADRANT of the RIGHT breast. No apparent complications.  Electronically Signed: By: Debby Satterfield M.D. On: 09/07/2021 09:02    ASSESSMENT & PLAN:  Patient is a 64 y.o. female presenting for history of DCIS  Assessment and Plan Assessment & Plan Left Ductal carcinoma in situ, post bilateral mastectomy Status post bilateral mastectomy for DCIS in 2023 without adjuvant therapy. Asymptomatic with very low recurrence risk due to absence of breast tissue. Adjuvant hormonal therapy not indicated. Routine imaging not recommended per guidelines. Reassured her about low recurrence risk.  - ASCO recommends frequent clinical surveillance with physical examination and history every 3 to 12 months for the first 5 years after mastectomy, followed by annual clinical examination in subsequent years -Discussed in detail that there is no role of endocrine therapy after bilateral  mastectomy for DCIS, as a primary benefit of endocrine therapy in the setting of contralateral breast cancer risk reduction, which is a limited treatment both breasts have been removed - Advised against routine imaging due to low recurrence risk. - No laboratory testing indicated. - Provided reassurance regarding low recurrence risk and addressed concerns.  Return to clinic in 3 months for physical exam.   No orders of the defined types were placed in this encounter.   The total time spent in the appointment was 40 minutes encounter with patients including review of chart and various tests results, discussions about plan of care and coordination of care plan   All questions were answered. The patient knows to call the clinic with any problems, questions or concerns. No barriers to learning was detected.  Mickiel Dry, MD 1/21/20263:40 PM "

## 2024-08-25 ENCOUNTER — Inpatient Hospital Stay

## 2024-08-25 ENCOUNTER — Inpatient Hospital Stay: Attending: Oncology | Admitting: Oncology

## 2024-08-25 ENCOUNTER — Encounter: Payer: Self-pay | Admitting: Oncology

## 2024-08-25 VITALS — BP 123/73 | HR 71 | Temp 98.1°F | Resp 18 | Ht 66.0 in | Wt 146.3 lb

## 2024-08-25 DIAGNOSIS — C50912 Malignant neoplasm of unspecified site of left female breast: Secondary | ICD-10-CM | POA: Diagnosis not present

## 2024-08-25 NOTE — Patient Instructions (Addendum)
 [facility]  Discharge Instructions  You were seen and examined today by Dr. Davonna. Dr. Davonna is a medical oncologist, meaning that she specializes in the treatment of cancer diagnoses. Dr. Davonna discussed your past medical history, family history of cancers, and the events that led to you being here today.  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 There was no indication for radiation therapy nor antiestrogen therapy at that time.  Obviously with a mastectomy, there is no role for mammogram moving forward. The recommendation moving forward is physical examination every 6 months here in the Cancer Center. If you remain very concerned, Dr. Davonna can discuss breast MRI - but there may be a risk that insurance would not cover a MRI based off of guidelines.  Follow-up as scheduled.  Thank you for choosing [facility] to provide your oncology and hematology care.   To afford each patient quality time with our provider, please arrive at least 15 minutes before your scheduled appointment time. You may need to reschedule your appointment if you arrive late (10 or more minutes). Arriving late affects you and other patients whose appointments are after yours.  Also, if you miss three or more appointments without notifying the office, you may be dismissed from the clinic at the providers discretion.    Again, thank you for choosing [facility].  Our hope is that these requests will decrease the amount of time that you wait before being seen by our physicians.   If you have a lab appointment with the Cancer Center - please note that after April 8th, all labs will be drawn in the cancer center.  You do not have to check in or register with the main entrance as you have in the past but will complete your check-in at the cancer center.             _____________________________________________________________  Should you have questions after your visit to [facility], please contact our office at [phone] and follow the prompts.  Our office hours are 8:00 a.m. to 4:30 p.m. Monday - Thursday and 8:00 a.m. to 2:30 p.m. Friday.  Please note that voicemails left after 4:00 p.m. may not be returned until the following business day.  We are closed weekends and all major holidays.  You do have access to a nurse 24-7, just call the main number to the clinic [phone] and do not press any options, hold on the line and a nurse will answer the phone.    For prescription refill requests, have your pharmacy contact our office and allow 72 hours.    Masks are no longer required in the cancer centers. If you would like for your care team to wear a mask while they are taking care of you, please let them know. You may have one support person who is at least 62 years old accompany you for your appointments.

## 2024-12-01 ENCOUNTER — Inpatient Hospital Stay: Admitting: Physician Assistant
